# Patient Record
Sex: Female | Born: 1965 | Race: White | Hispanic: No | Marital: Married | Smoking: Never smoker
Health system: Southern US, Community
[De-identification: ages and names within clinical notes are randomized; demographics above are authoritative.]

## PROBLEM LIST (undated history)

## (undated) DIAGNOSIS — R7303 Prediabetes: Secondary | ICD-10-CM

## (undated) DIAGNOSIS — I10 Essential (primary) hypertension: Secondary | ICD-10-CM

## (undated) DIAGNOSIS — N61 Mastitis without abscess: Secondary | ICD-10-CM

## (undated) DIAGNOSIS — E785 Hyperlipidemia, unspecified: Secondary | ICD-10-CM

## (undated) DIAGNOSIS — M8580 Other specified disorders of bone density and structure, unspecified site: Secondary | ICD-10-CM

## (undated) DIAGNOSIS — C50919 Malignant neoplasm of unspecified site of unspecified female breast: Secondary | ICD-10-CM

## (undated) DIAGNOSIS — L270 Generalized skin eruption due to drugs and medicaments taken internally: Secondary | ICD-10-CM

## (undated) DIAGNOSIS — M549 Dorsalgia, unspecified: Secondary | ICD-10-CM

## (undated) DIAGNOSIS — Z8669 Personal history of other diseases of the nervous system and sense organs: Secondary | ICD-10-CM

## (undated) DIAGNOSIS — Z923 Personal history of irradiation: Secondary | ICD-10-CM

## (undated) DIAGNOSIS — K219 Gastro-esophageal reflux disease without esophagitis: Secondary | ICD-10-CM

## (undated) DIAGNOSIS — M2550 Pain in unspecified joint: Secondary | ICD-10-CM

## (undated) DIAGNOSIS — C773 Secondary and unspecified malignant neoplasm of axilla and upper limb lymph nodes: Secondary | ICD-10-CM

## (undated) DIAGNOSIS — B372 Candidiasis of skin and nail: Secondary | ICD-10-CM

## (undated) DIAGNOSIS — Z9221 Personal history of antineoplastic chemotherapy: Secondary | ICD-10-CM

## (undated) DIAGNOSIS — R011 Cardiac murmur, unspecified: Secondary | ICD-10-CM

## (undated) DIAGNOSIS — J45909 Unspecified asthma, uncomplicated: Secondary | ICD-10-CM

## (undated) HISTORY — DX: Hyperlipidemia, unspecified: E78.5

## (undated) HISTORY — PX: TONSILLECTOMY AND ADENOIDECTOMY: SUR1326

## (undated) HISTORY — PX: BREAST LUMPECTOMY: SHX2

## (undated) HISTORY — DX: Pain in unspecified joint: M25.50

## (undated) HISTORY — DX: Prediabetes: R73.03

## (undated) HISTORY — DX: Unspecified asthma, uncomplicated: J45.909

## (undated) HISTORY — DX: Other specified disorders of bone density and structure, unspecified site: M85.80

## (undated) HISTORY — DX: Dorsalgia, unspecified: M54.9

---

## 1998-04-15 ENCOUNTER — Other Ambulatory Visit: Admission: RE | Admit: 1998-04-15 | Discharge: 1998-04-15 | Payer: Self-pay | Admitting: Obstetrics and Gynecology

## 1999-06-20 ENCOUNTER — Other Ambulatory Visit: Admission: RE | Admit: 1999-06-20 | Discharge: 1999-06-20 | Payer: Self-pay | Admitting: Obstetrics and Gynecology

## 1999-11-22 ENCOUNTER — Encounter
Admission: RE | Admit: 1999-11-22 | Discharge: 2000-01-30 | Payer: Self-pay | Admitting: Physical Medicine & Rehabilitation

## 2000-01-29 ENCOUNTER — Encounter: Payer: Self-pay | Admitting: Obstetrics and Gynecology

## 2000-01-29 ENCOUNTER — Encounter (INDEPENDENT_AMBULATORY_CARE_PROVIDER_SITE_OTHER): Payer: Self-pay | Admitting: Specialist

## 2000-01-29 ENCOUNTER — Inpatient Hospital Stay (HOSPITAL_COMMUNITY): Admission: AD | Admit: 2000-01-29 | Discharge: 2000-02-02 | Payer: Self-pay | Admitting: Obstetrics and Gynecology

## 2000-03-15 ENCOUNTER — Other Ambulatory Visit: Admission: RE | Admit: 2000-03-15 | Discharge: 2000-03-15 | Payer: Self-pay | Admitting: Obstetrics and Gynecology

## 2000-05-04 ENCOUNTER — Ambulatory Visit (HOSPITAL_COMMUNITY): Admission: RE | Admit: 2000-05-04 | Discharge: 2000-05-04 | Payer: Self-pay | Admitting: Gastroenterology

## 2000-05-04 ENCOUNTER — Encounter (INDEPENDENT_AMBULATORY_CARE_PROVIDER_SITE_OTHER): Payer: Self-pay | Admitting: Specialist

## 2001-06-14 ENCOUNTER — Encounter: Payer: Self-pay | Admitting: Gastroenterology

## 2001-06-14 ENCOUNTER — Encounter: Admission: RE | Admit: 2001-06-14 | Discharge: 2001-06-14 | Payer: Self-pay | Admitting: Gastroenterology

## 2001-06-17 ENCOUNTER — Other Ambulatory Visit: Admission: RE | Admit: 2001-06-17 | Discharge: 2001-06-17 | Payer: Self-pay | Admitting: Obstetrics and Gynecology

## 2001-09-26 ENCOUNTER — Encounter: Admission: RE | Admit: 2001-09-26 | Discharge: 2001-09-26 | Payer: Self-pay | Admitting: Obstetrics and Gynecology

## 2001-09-26 ENCOUNTER — Encounter: Payer: Self-pay | Admitting: Obstetrics and Gynecology

## 2002-08-27 ENCOUNTER — Other Ambulatory Visit: Admission: RE | Admit: 2002-08-27 | Discharge: 2002-08-27 | Payer: Self-pay | Admitting: Obstetrics and Gynecology

## 2003-09-09 ENCOUNTER — Other Ambulatory Visit: Admission: RE | Admit: 2003-09-09 | Discharge: 2003-09-09 | Payer: Self-pay | Admitting: Obstetrics and Gynecology

## 2004-10-01 ENCOUNTER — Emergency Department (HOSPITAL_COMMUNITY): Admission: EM | Admit: 2004-10-01 | Discharge: 2004-10-02 | Payer: Self-pay | Admitting: Emergency Medicine

## 2004-11-17 ENCOUNTER — Other Ambulatory Visit: Admission: RE | Admit: 2004-11-17 | Discharge: 2004-11-17 | Payer: Self-pay | Admitting: Obstetrics and Gynecology

## 2004-12-01 ENCOUNTER — Ambulatory Visit (HOSPITAL_COMMUNITY): Admission: RE | Admit: 2004-12-01 | Discharge: 2004-12-01 | Payer: Self-pay | Admitting: Obstetrics and Gynecology

## 2005-03-20 ENCOUNTER — Encounter: Admission: RE | Admit: 2005-03-20 | Discharge: 2005-03-20 | Payer: Self-pay | Admitting: Obstetrics and Gynecology

## 2005-11-21 ENCOUNTER — Other Ambulatory Visit: Admission: RE | Admit: 2005-11-21 | Discharge: 2005-11-21 | Payer: Self-pay | Admitting: Obstetrics and Gynecology

## 2006-09-17 ENCOUNTER — Ambulatory Visit (HOSPITAL_COMMUNITY): Admission: RE | Admit: 2006-09-17 | Discharge: 2006-09-17 | Payer: Self-pay | Admitting: Obstetrics and Gynecology

## 2007-01-15 ENCOUNTER — Other Ambulatory Visit: Admission: RE | Admit: 2007-01-15 | Discharge: 2007-01-15 | Payer: Self-pay | Admitting: Obstetrics and Gynecology

## 2007-05-09 DIAGNOSIS — C50919 Malignant neoplasm of unspecified site of unspecified female breast: Secondary | ICD-10-CM

## 2007-05-09 HISTORY — DX: Malignant neoplasm of unspecified site of unspecified female breast: C50.919

## 2007-05-09 HISTORY — PX: BREAST SURGERY: SHX581

## 2007-05-09 HISTORY — PX: OTHER SURGICAL HISTORY: SHX169

## 2007-05-09 HISTORY — PX: BREAST LUMPECTOMY: SHX2

## 2007-07-18 ENCOUNTER — Encounter (INDEPENDENT_AMBULATORY_CARE_PROVIDER_SITE_OTHER): Payer: Self-pay | Admitting: Diagnostic Radiology

## 2007-07-18 ENCOUNTER — Encounter: Admission: RE | Admit: 2007-07-18 | Discharge: 2007-07-18 | Payer: Self-pay | Admitting: Obstetrics and Gynecology

## 2007-07-23 ENCOUNTER — Ambulatory Visit (HOSPITAL_COMMUNITY): Admission: RE | Admit: 2007-07-23 | Discharge: 2007-07-23 | Payer: Self-pay | Admitting: Obstetrics and Gynecology

## 2007-09-10 ENCOUNTER — Ambulatory Visit: Payer: Self-pay | Admitting: Thoracic Surgery

## 2007-09-11 ENCOUNTER — Encounter (HOSPITAL_COMMUNITY): Admission: RE | Admit: 2007-09-11 | Discharge: 2007-10-11 | Payer: Self-pay

## 2009-02-09 IMAGING — MG MM DIAGNOSTIC UNILATERAL L
8 of 9 series · 8 of 9 positions shown · non-contrast
Comparison: none

DG DIAGNOSTIC BILATERAL
Bilateral CC and MLO view(s) were taken.

LEFT BREAST ULTRASOUND
Technologist: Kiri Jim, Medical
DIGITAL BILATERAL DIAGNOSTIC MAMMOGRAM WITH CAD AND LEFT BREAST ULTRASOUND:
CLINICAL DATA: 41-year-old with palpable left breast mass.

[L CC (1 of 3)]
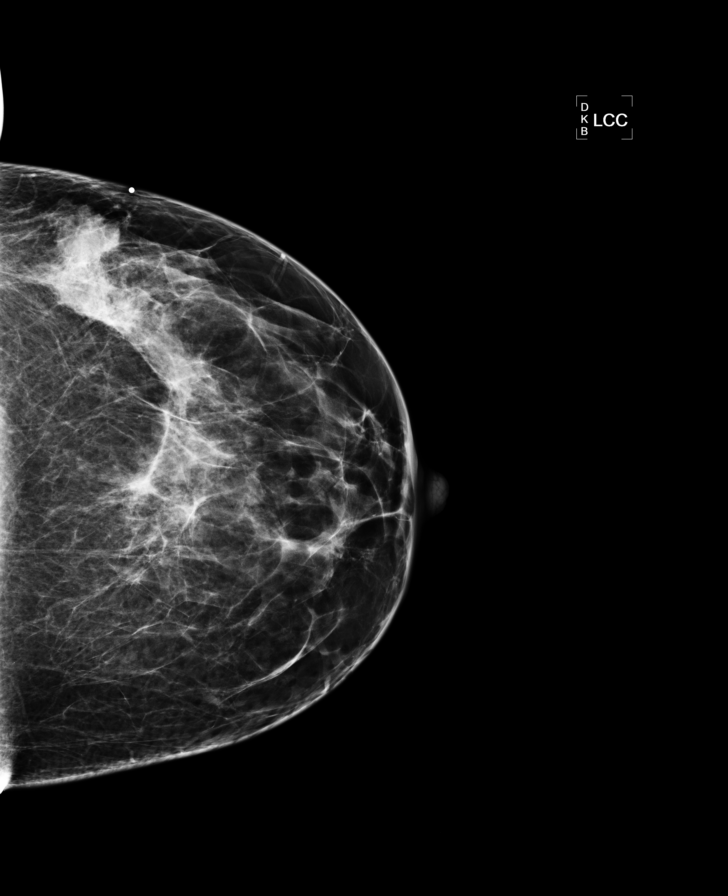

[R CC]
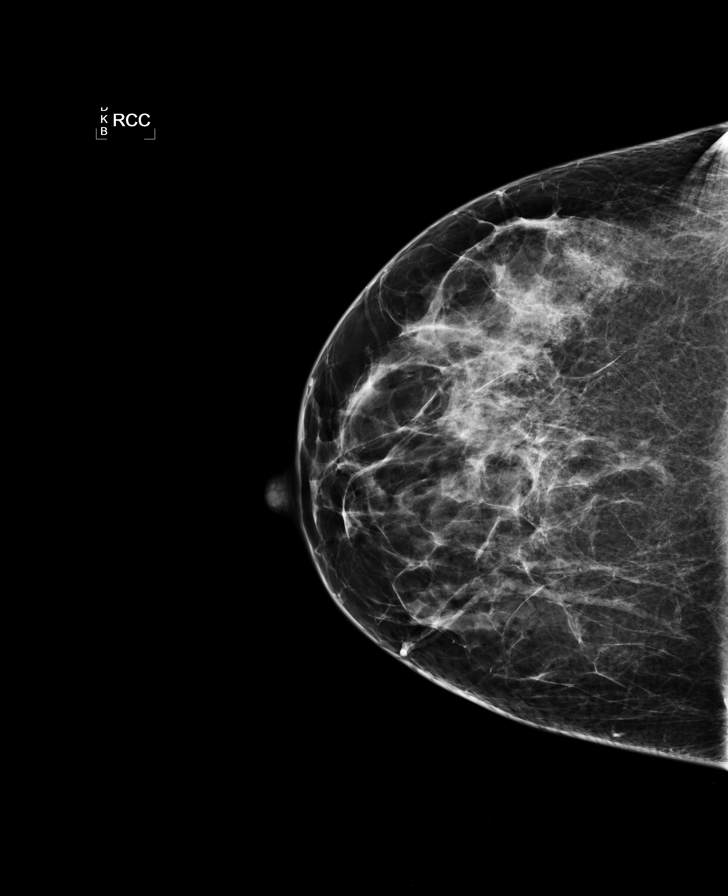

[L CC (2 of 3)]
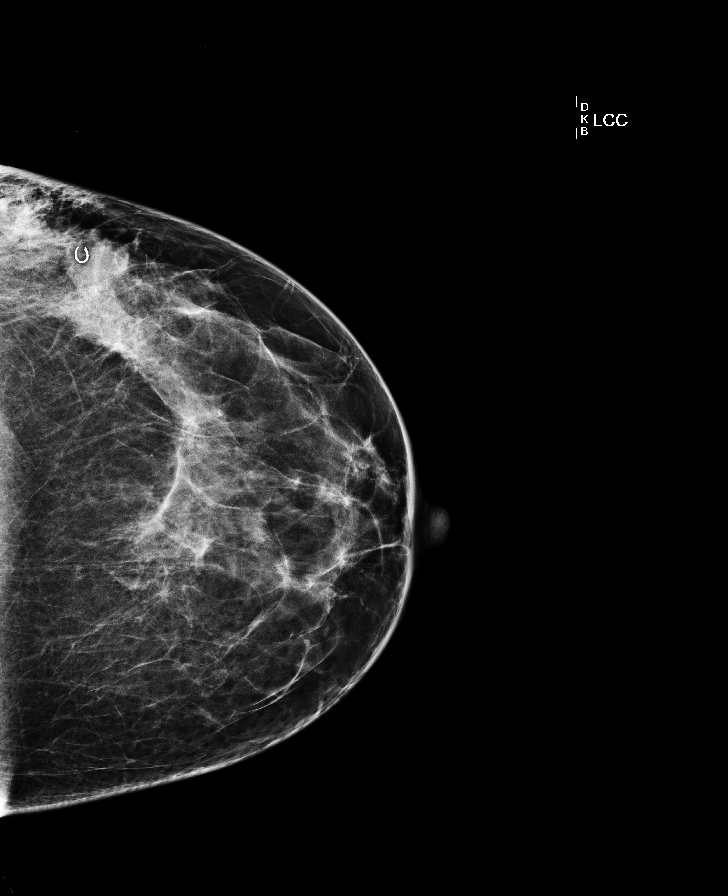

[L MLO]
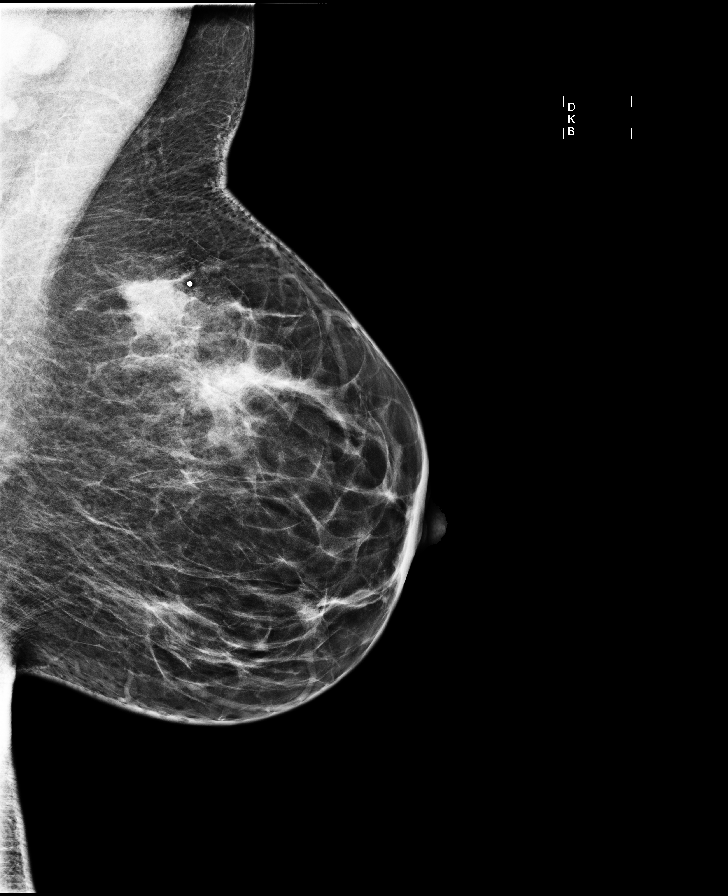

[L TAN]
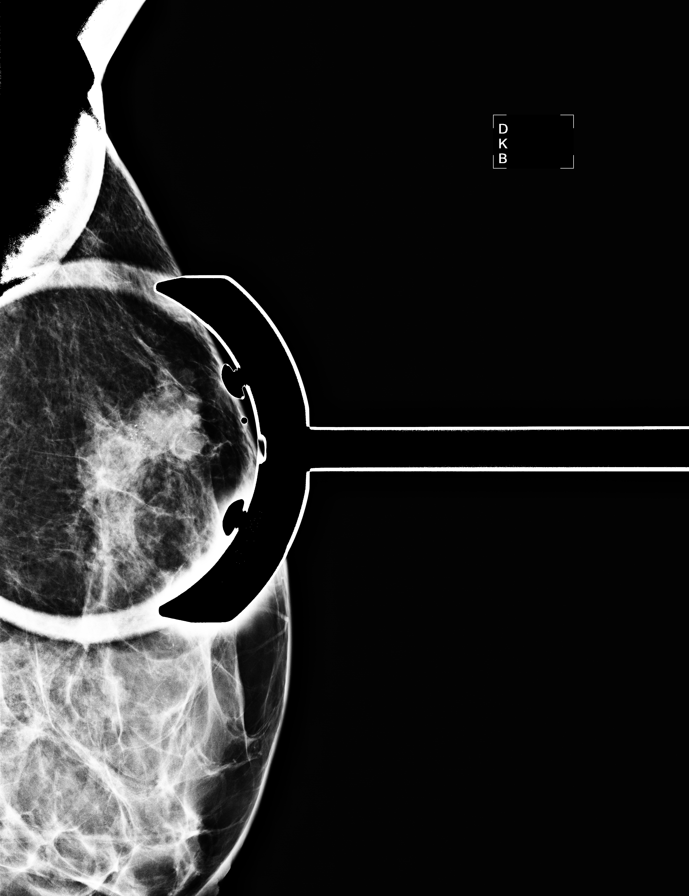

[R MLO]
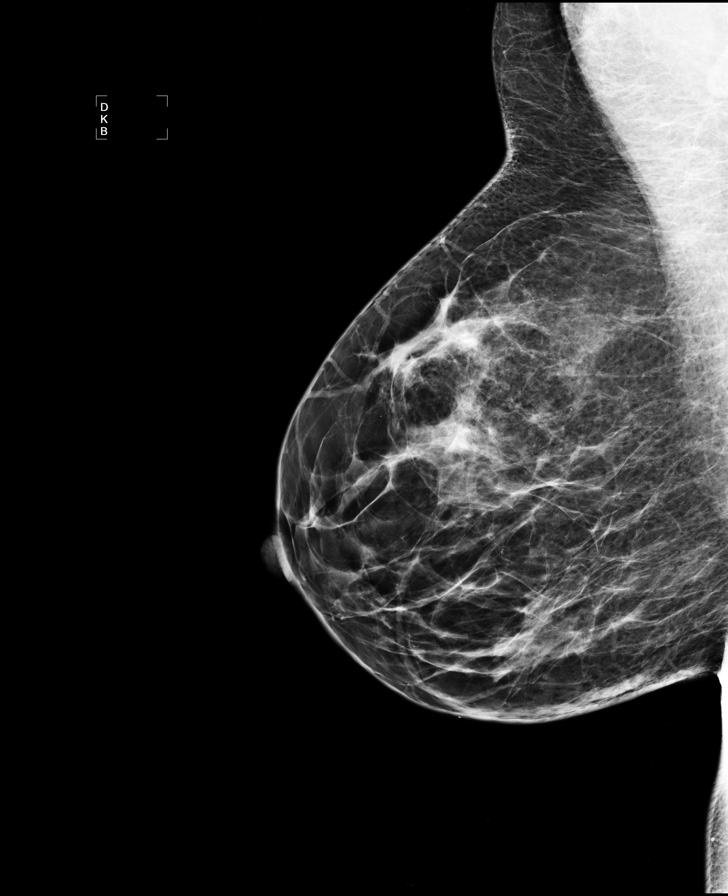

[L CC (3 of 3)]
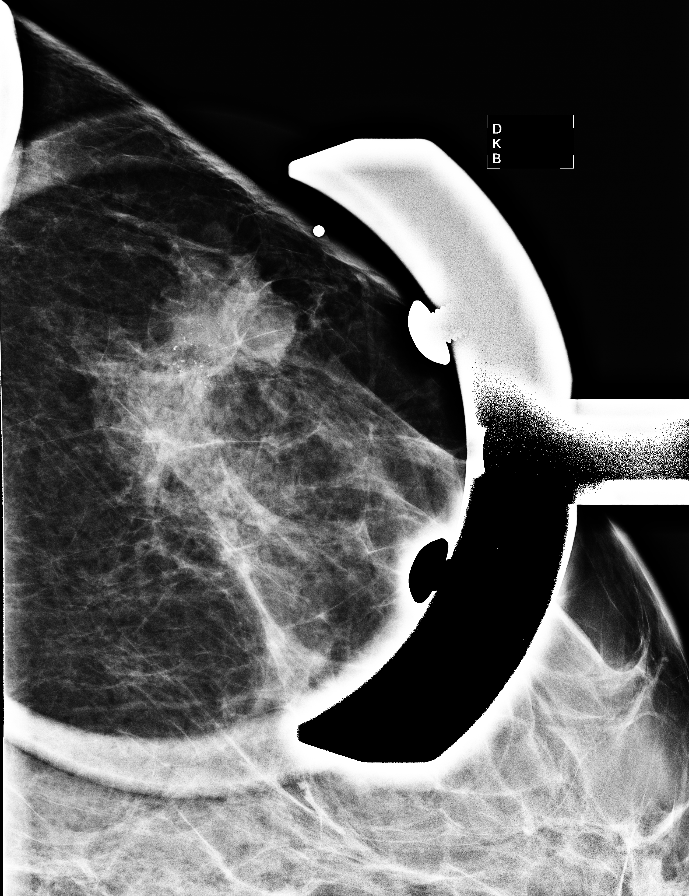

[L ML]
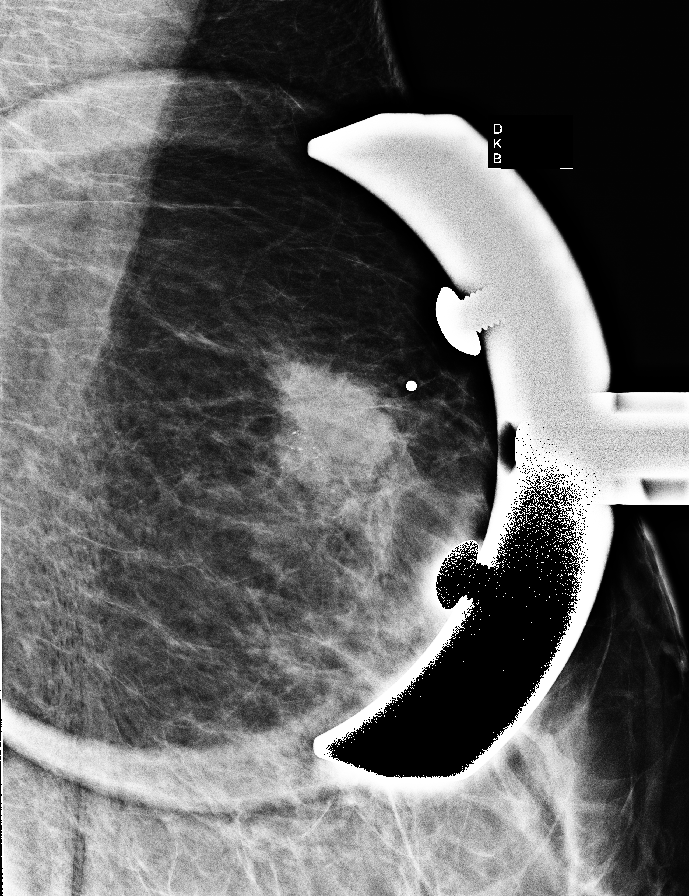

[8 of 9 positions shown; findings below may reference images not displayed]

Comparison mammogram from [HOSPITAL] 09-17-06.  There is an irregular mass in the lateral 
portion of the left breast corresponding to the area of clinical concern.  This mass is associated 
with pleomorphic calcifications.  The findings are worrisome for invasive ductal carcinoma and 
DCIS.  Biopsy is suggested.  No other abnormality is identified on the left.

Right breast shows no mass, architectural distortion, or suspicious calcifications.

On physical exam, I do palpate a firm mobile mass in the 2 o'clock position of the left breast 12 
cm from the nipple.  This lesion is associated with skin dimpling.  Ultrasound of this region shows
an irregular mass associated with internal calcifications which measures 2.3 x 1.2 x 1.6 cm.  
Evaluation of the left axilla shows no enlarged lymph nodes.
IMPRESSION: Mass in the lateral portion of the left breast is suspicious for invasive ductal carcinoma and 
DCIS.  Biopsy is suggested.  We discussed biopsy options. The patient elects ultrasound-guided core
biopsy.  This is performed on the same day and dictated separately.

ASSESSMENT: Highly suggestive of malignancy - BI-RADS 5

Needle biopsy of the left breast.
,

## 2009-05-08 DIAGNOSIS — N61 Mastitis without abscess: Secondary | ICD-10-CM

## 2009-05-08 HISTORY — DX: Mastitis without abscess: N61.0

## 2009-12-24 ENCOUNTER — Emergency Department (HOSPITAL_COMMUNITY): Admission: EM | Admit: 2009-12-24 | Discharge: 2009-12-24 | Payer: Self-pay | Admitting: Emergency Medicine

## 2010-05-29 ENCOUNTER — Encounter: Payer: Self-pay | Admitting: Obstetrics and Gynecology

## 2010-09-20 NOTE — Letter (Signed)
Sep 10, 2007   Artist Pais, M.D.  [address], [address]  [place], [place] [address]   Re:  Karen Trujillo, Karen Trujillo             DOB:  1966/04/18   Dear Lafonda Mosses:   I appreciate the opportunity of seeing Ms. Karen Trujillo. 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  This was done at  [facility]. In the process of getting her MRI of the breast, she was  found to have a cystic 3.1 x 2.1 x 1.8 cm mass in the inferior portion  of the middle mediastinum.  CT scan done at [facility] shows the mass, and  they felt this was a cystic dilatation of the thoracic duct.  Other  possibilities could be a bronchogenic cyst or a duplication cyst at the  esophagus. She is asymptomatic from that standpoint. She had no  dysphasia, hemoptysis, or dyspnea.   MEDICATIONS:  1. She is getting ready to start chemotherapy  2. Atenolol 25 mg a day.  3. Multivitamins.  4. Calcium.  5. She takes Percocet p.r.n.  6. She has been on Reglan p.r.n.  7. Relafen p.r.n.  8. She has had a recent course of Keflex 500 mg three times a day.   ALLERGIES:  She has no allergies.   PAST MEDICAL HISTORY:  1. Hypertension.  2. Hypercholesterolemia.   FAMILY HISTORY:  Noncontributory.   SOCIAL HISTORY:  She is married. Does not smoke or drink alcohol on a  regular basis.   REVIEW OF SYSTEMS:  She been stable.  CARDIAC:  No angina or atrial fibrillation.  PULMONARY:  See History of Present Illness.  No bronchitis or cough.  GI:  No nausea, vomiting, constipation, and no GERD.  GU:  No kidney disease, dysuria or frequent urination.  VASCULAR:  No claudication, DVT, TIAs.  NEUROLOGIC:  No dizziness, headaches, blackouts, seizures.  MUSCULOSKELETAL:  Arthritis.  PSYCHIATRIC:  No psychiatric illness.  HEENT:  No change in her eyesight or hearing.  HEMATOLOGIC:  No  problems with bleeding or clotting disorders.   PHYSICAL EXAMINATION:  She is a well-developed Caucasian female in no  acute distress.  Head, eyes, ears, nose and throat are unremarkable.  Screening pulmonary function tests showed an FVC of 3.27, FEV-1 of 2.80.  Chest is clear to auscultation and percussion. Left breast has surgical  scars.  Heart:  Regular sinus rhythm, no murmur.  Abdomen:  Soft.  There  is no splenomegaly. Pulses are 2+.  There is no clubbing or edema.  Neurological:  Intact.   I feel, after reviewing all the studies, that this probably is a cystic  dilatation of the thoracic duct, and probably nothing needs to be done,  at least not at this time while she is undergoing her chemotherapy.  I  did suggest that she get a followup CT on this in about 6 months just to  be sure that there is no further dilatation. The only problem I have  seen in the past, if this is a thoracic duct cyst, is a kind of thorax.  If she develops any symptomatology or this continues to enlarge from  followup CT scans, I would be happy to see her again at any time.   Sincerely,   Ines Bloomer, M.D.  Electronically Signed  DPB/MEDQ  D:  09/10/2007  T:  09/10/2007  Job:  [id number]

## 2010-09-23 NOTE — Op Note (Signed)
[facility]  Patient:    Karen Trujillo, Karen Trujillo                    MRN: [id number] Proc. Date: 01/30/00 Adm. Date:  [id number] Attending:  Madelyn Flavors                           Operative Report  PREOPERATIVE DIAGNOSES:       1. Intrauterine pregnancy at 40-1/2 weeks                                  estimated gestational age.                               2. Previous low transverse cesarean section.                               3. Arrest of cervical dilation.  POSTOPERATIVE DIAGNOSES:      1. Intrauterine pregnancy at 40-1/2 weeks                                  estimated gestational age.                               2. Previous low transverse cesarean section.                               3. Arrest of cervical dilation.  OPERATION:                    Repeat low transverse cesarean section.  SURGEON:                      Guy Sandifer. Arleta Creek, M.D.  ASSISTANT:  ANESTHESIA:                   Epidural anesthesia.  ANESTHESIOLOGIST:             Ellison Hughs., M.D.  ESTIMATED BLOOD LOSS:         800 cc.  FINDINGS:                     A viable female infant with Apgars of 8 and 9 at one and five minutes, respectively. Birth weight pending. Arterial cord pH 7.32.  INDICATIONS AND CONSENT:      The patient is a 45 year old married white female, G2, P1, history of low transverse cesarean section. She desired to have vaginal birth after C-section.  Risks of uterine rupture and fetal compromise versus  risks of repeat cesarean section have been discussed.  The patient was admitted on November 28, 1999, when she was noted to have an AFI in the office of less than 1 cm.  She underwent Cervidil followed by Pitocin.  At 1:32 p.m. the cervix is 2 cm dilated, 70% effaced, -2 station. Artificial rupture of membranes of clear fluid is carried out.  An IUPC is placed.  The patient continues with an excellent labor pattern and does not progress  beyond 2  cm dilation, 80% effacement and -2 station.  Recommendation for repeat cesarean section is made. Possible risks and complications of the cesarean section are discussed including, but not limited to infection, organ damage, bleeding requiring transfusion of blood products with possible transfusion reaction, HIV and hepatitis acquisition, DVT, PE, and pneumonia. All questions are answered. Consent is signed and on the chart.  DESCRIPTION OF PROCEDURE:     The patient is taken to the operating room where epidural anesthetic is augmented to surgical level.  Foley catheter is in place.  She is placed in the dorsal supine position with 15 degree left lateral wedge. She is prepped and draped in sterile fashion.  After testing for adequate epidural anesthesia, skin is entered through the previous low transverse scar and dissection is carried out in layers to the peritoneum. The peritoneum is sharply entered and extended superiorly and inferiorly. Vesicouterine peritoneum is taken down cephalolaterally.  Bladder flap is developed and the bladder blade is placed.  The uterus is incised in a low transverse manner.  Uterine cavity is entered bluntly with Kelly clamp and then the uterine incision is extended cephalolaterally with the fingers. Infant is noted to be in the right occiput posterior presentation. Vertex is delivered without difficulty.  Oropharynx and nasopharynx are thoroughly suctioned and the infant is delivered.  Good cry and tone is noted.  Cord is clamped and cut.  The infant is handed to the awaiting pediatrics team. Placenta is manually delivered and sent to pathology.  The uterus is closed in running locking fashion with 0 Monocryl suture.  Figure-of-eight sutures are used at the right angle to obtain complete hemostasis.  Tubes and ovaries are normal.  Anterior peritoneum is closed in running fashion with 0 Monocryl sutures which is also used to reapproximate the  pyramidalis muscle in the midline.  The anterior rectus fascia is closed in running fashion with 0 PDS suture and the skin is closed with clips.  All sponge, needle and instrument counts are correct and the patient is transferred to the recovery room in stable condition. DD:  01/30/00 TD:  01/31/00 Job: [id number]

## 2011-06-15 ENCOUNTER — Inpatient Hospital Stay (HOSPITAL_COMMUNITY)
Admission: AD | Admit: 2011-06-15 | Discharge: 2011-06-17 | DRG: 276 | Disposition: A | Discharge status: Home / Self Care | Payer: BC Managed Care – PPO | Source: Ambulatory Visit | Attending: Internal Medicine | Admitting: Internal Medicine

## 2011-06-15 ENCOUNTER — Encounter (HOSPITAL_COMMUNITY): Payer: Self-pay

## 2011-06-15 DIAGNOSIS — Z23 Encounter for immunization: Secondary | ICD-10-CM

## 2011-06-15 DIAGNOSIS — E876 Hypokalemia: Secondary | ICD-10-CM | POA: Diagnosis present

## 2011-06-15 DIAGNOSIS — L0390 Cellulitis, unspecified: Secondary | ICD-10-CM | POA: Diagnosis present

## 2011-06-15 DIAGNOSIS — Z853 Personal history of malignant neoplasm of breast: Secondary | ICD-10-CM

## 2011-06-15 DIAGNOSIS — G43909 Migraine, unspecified, not intractable, without status migrainosus: Secondary | ICD-10-CM | POA: Diagnosis present

## 2011-06-15 DIAGNOSIS — I10 Essential (primary) hypertension: Secondary | ICD-10-CM | POA: Diagnosis present

## 2011-06-15 DIAGNOSIS — T368X5A Adverse effect of other systemic antibiotics, initial encounter: Secondary | ICD-10-CM | POA: Diagnosis not present

## 2011-06-15 DIAGNOSIS — L270 Generalized skin eruption due to drugs and medicaments taken internally: Secondary | ICD-10-CM | POA: Diagnosis not present

## 2011-06-15 DIAGNOSIS — L03319 Cellulitis of trunk, unspecified: Secondary | ICD-10-CM | POA: Diagnosis present

## 2011-06-15 DIAGNOSIS — B372 Candidiasis of skin and nail: Secondary | ICD-10-CM | POA: Diagnosis present

## 2011-06-15 DIAGNOSIS — L539 Erythematous condition, unspecified: Secondary | ICD-10-CM | POA: Diagnosis not present

## 2011-06-15 DIAGNOSIS — L02219 Cutaneous abscess of trunk, unspecified: Secondary | ICD-10-CM | POA: Diagnosis present

## 2011-06-15 DIAGNOSIS — N61 Mastitis without abscess: Principal | ICD-10-CM | POA: Diagnosis present

## 2011-06-15 DIAGNOSIS — D649 Anemia, unspecified: Secondary | ICD-10-CM | POA: Diagnosis present

## 2011-06-15 HISTORY — DX: Essential (primary) hypertension: I10

## 2011-06-15 HISTORY — DX: Personal history of other diseases of the nervous system and sense organs: Z86.69

## 2011-06-15 HISTORY — DX: Malignant neoplasm of unspecified site of unspecified female breast: C50.919

## 2011-06-15 HISTORY — DX: Secondary and unspecified malignant neoplasm of axilla and upper limb lymph nodes: C77.3

## 2011-06-15 HISTORY — DX: Candidiasis of skin and nail: B37.2

## 2011-06-15 HISTORY — DX: Generalized skin eruption due to drugs and medicaments taken internally: L27.0

## 2011-06-15 HISTORY — DX: Mastitis without abscess: N61.0

## 2011-06-15 LAB — COMPREHENSIVE METABOLIC PANEL
ALT: 39 U/L — ABNORMAL HIGH (ref 0–35)
AST: 29 U/L (ref 0–37)
Albumin: 3.4 g/dL — ABNORMAL LOW (ref 3.5–5.2)
Alkaline Phosphatase: 72 U/L (ref 39–117)
CO2: 25 mEq/L (ref 19–32)
Chloride: 104 mEq/L (ref 96–112)
GFR calc non Af Amer: 86 mL/min — ABNORMAL LOW (ref >90)
Potassium: 3.3 mEq/L — ABNORMAL LOW (ref 3.5–5.1)
Total Bilirubin: 0.4 mg/dL (ref 0.3–1.2)

## 2011-06-15 LAB — DIFFERENTIAL
Basophils Absolute: 0 10*3/uL (ref 0.0–0.1)
Basophils Relative: 0 % (ref 0–1)
Eosinophils Absolute: 0.1 10*3/uL (ref 0.0–0.7)
Eosinophils Relative: 1 % (ref 0–5)
Lymphocytes Relative: 13 % (ref 12–46)
Monocytes Absolute: 0.3 10*3/uL (ref 0.1–1.0)

## 2011-06-15 LAB — URINALYSIS, ROUTINE W REFLEX MICROSCOPIC
Glucose, UA: NEGATIVE mg/dL
Hgb urine dipstick: NEGATIVE
Ketones, ur: NEGATIVE mg/dL
Leukocytes, UA: NEGATIVE
pH: 5.5 (ref 5.0–8.0)

## 2011-06-15 LAB — CBC
HCT: 33.8 % — ABNORMAL LOW (ref 36.0–46.0)
MCH: 28.9 pg (ref 26.0–34.0)
MCHC: 33.7 g/dL (ref 30.0–36.0)
MCV: 85.8 fL (ref 78.0–100.0)
RDW: 13.3 % (ref 11.5–15.5)

## 2011-06-15 MED ORDER — LEVOFLOXACIN IN D5W 750 MG/150ML IV SOLN
750.0000 mg | INTRAVENOUS | Status: DC
  Administered 2011-06-15 – 2011-06-16 (×2): 750 mg via INTRAVENOUS
  Filled 2011-06-15 (×4): qty 150

## 2011-06-15 MED ORDER — VANCOMYCIN HCL IN DEXTROSE 1-5 GM/200ML-% IV SOLN
INTRAVENOUS | Status: AC
  Filled 2011-06-15: qty 200

## 2011-06-15 MED ORDER — ALUM & MAG HYDROXIDE-SIMETH 200-200-20 MG/5ML PO SUSP: 30.0000 mL | Freq: Four times a day (QID) | ORAL | Status: DC | PRN

## 2011-06-15 MED ORDER — ONDANSETRON HCL 4 MG/2ML IJ SOLN: 4.0000 mg | Freq: Four times a day (QID) | INTRAMUSCULAR | Status: DC | PRN

## 2011-06-15 MED ORDER — DOCUSATE SODIUM 100 MG PO CAPS
100.0000 mg | ORAL_CAPSULE | Freq: Every day | ORAL | Status: DC
  Administered 2011-06-16 – 2011-06-17 (×2): 100 mg via ORAL
  Filled 2011-06-15 (×2): qty 1

## 2011-06-15 MED ORDER — LETROZOLE 2.5 MG PO TABS
2.5000 mg | ORAL_TABLET | Freq: Every day | ORAL | Status: DC
  Filled 2011-06-15: qty 1

## 2011-06-15 MED ORDER — INFLUENZA VIRUS VACC SPLIT PF IM SUSP
0.5000 mL | INTRAMUSCULAR | Status: AC
  Administered 2011-06-16: 0.5 mL via INTRAMUSCULAR
  Filled 2011-06-15: qty 0.5

## 2011-06-15 MED ORDER — LETROZOLE 2.5 MG PO TABS
2.5000 mg | ORAL_TABLET | Freq: Every day | ORAL | Status: DC
  Administered 2011-06-15 – 2011-06-16 (×2): 2.5 mg via ORAL
  Filled 2011-06-15 (×4): qty 1

## 2011-06-15 MED ORDER — GUAIFENESIN-DM 100-10 MG/5ML PO SYRP: 5.0000 mL | ORAL_SOLUTION | ORAL | Status: DC | PRN

## 2011-06-15 MED ORDER — VANCOMYCIN HCL IN DEXTROSE 1-5 GM/200ML-% IV SOLN
1000.0000 mg | Freq: Two times a day (BID) | INTRAVENOUS | Status: DC
  Administered 2011-06-16: 1000 mg via INTRAVENOUS
  Filled 2011-06-15: qty 200

## 2011-06-15 MED ORDER — OXYCODONE HCL 5 MG PO TABS
5.0000 mg | ORAL_TABLET | ORAL | Status: DC | PRN
  Administered 2011-06-15: 5 mg via ORAL
  Filled 2011-06-15: qty 1

## 2011-06-15 MED ORDER — ACETAMINOPHEN 325 MG PO TABS
650.0000 mg | ORAL_TABLET | Freq: Four times a day (QID) | ORAL | Status: DC | PRN
  Administered 2011-06-15: 650 mg via ORAL
  Filled 2011-06-15: qty 2

## 2011-06-15 MED ORDER — ONDANSETRON HCL 4 MG PO TABS: 4.0000 mg | ORAL_TABLET | Freq: Four times a day (QID) | ORAL | Status: DC | PRN

## 2011-06-15 MED ORDER — POTASSIUM CHLORIDE IN NACL 20-0.9 MEQ/L-% IV SOLN
INTRAVENOUS | Status: DC
  Administered 2011-06-15: 18:00:00 via INTRAVENOUS

## 2011-06-15 MED ORDER — ACETAMINOPHEN 650 MG RE SUPP: 650.0000 mg | Freq: Four times a day (QID) | RECTAL | Status: DC | PRN

## 2011-06-15 MED ORDER — MORPHINE SULFATE 2 MG/ML IJ SOLN: 2.0000 mg | INTRAMUSCULAR | Status: DC | PRN

## 2011-06-15 MED ORDER — SODIUM CHLORIDE 0.9 % IJ SOLN
INTRAMUSCULAR | Status: AC
  Filled 2011-06-15: qty 3

## 2011-06-15 MED ORDER — VANCOMYCIN HCL IN DEXTROSE 1-5 GM/200ML-% IV SOLN
1000.0000 mg | Freq: Once | INTRAVENOUS | Status: AC
  Administered 2011-06-15: 1000 mg via INTRAVENOUS
  Filled 2011-06-15: qty 200
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
## 2011-06-15 NOTE — Progress Notes (Signed)
ANTIBIOTIC CONSULT NOTE - INITIAL  Pharmacy Consult for Vancomycin Indication: Cellulitis  No Known Allergies  Patient Measurements: Height: 5\' 3"  (160 cm) Weight: 171 lb (77.565 kg) IBW/kg (Calculated) : 52.4   Vital Signs: Temp: 97.6 F (36.4 C) (02/07 1552) Temp src: Oral (02/07 1552) BP: 143/86 mmHg (02/07 1552) Pulse Rate: 92  (02/07 1552) Intake/Output from previous day:   Intake/Output from this shift:    Labs:  [facility] 06/15/11 1838  WBC 6.3  HGB 11.4*  PLT 150  LABCREA --  CREATININE 0.81   Estimated Creatinine Clearance: 85.6 ml/min (by C-G formula based on Cr of 0.81). No results found for this basename: VANCOTROUGH:2,VANCOPEAK:2,VANCORANDOM:2,GENTTROUGH:2,GENTPEAK:2,GENTRANDOM:2,TOBRATROUGH:2,TOBRAPEAK:2,TOBRARND:2,AMIKACINPEAK:2,AMIKACINTROU:2,AMIKACIN:2, in the last 72 hours   Microbiology: No results found for this or any previous visit (from the past 720 hour(s)).  Medical History: Past Medical History  Diagnosis Date  . Breast cancer metastasized to axillary lymph node 2009    Status post lumpectomy, axillary node dissection, chemotherapy, and radiation therapy.  . Hypertension   . History of migraine headaches   . Cellulitis of breast 2011    Medications:     . docusate sodium  100 mg Oral Daily  . influenza  inactive virus vaccine  0.5 mL Intramuscular Tomorrow-1000  . letrozole  2.5 mg Oral QHS  . levofloxacin (LEVAQUIN) IV  750 mg Intravenous Q24H  . sodium chloride      . vancomycin  1,000 mg Intravenous Once  . DISCONTD: letrozole  2.5 mg Oral Daily    Assessment: Okay for Protocol  Goal of Therapy:  Vancomycin trough level 10-15 mcg/ml  Plan:  Vancomycin 1000mg  IV every 12 hours. Measure antibiotic drug levels at steady state  Mady Gemma 06/15/2011,8:17 PM

## 2011-06-16 ENCOUNTER — Encounter (HOSPITAL_COMMUNITY): Payer: Self-pay | Admitting: Internal Medicine

## 2011-06-16 DIAGNOSIS — L270 Generalized skin eruption due to drugs and medicaments taken internally: Secondary | ICD-10-CM

## 2011-06-16 HISTORY — DX: Generalized skin eruption due to drugs and medicaments taken internally: L27.0

## 2011-06-16 LAB — CBC
HCT: 34.3 % — ABNORMAL LOW (ref 36.0–46.0)
MCV: 85.8 fL (ref 78.0–100.0)
RBC: 4 MIL/uL (ref 3.87–5.11)
RDW: 13.4 % (ref 11.5–15.5)
WBC: 5.9 10*3/uL (ref 4.0–10.5)

## 2011-06-16 LAB — BASIC METABOLIC PANEL
CO2: 22 mEq/L (ref 19–32)
Chloride: 108 mEq/L (ref 96–112)
Creatinine, Ser: 0.72 mg/dL (ref 0.50–1.10)

## 2011-06-16 MED ORDER — SODIUM CHLORIDE 0.9 % IJ SOLN
INTRAMUSCULAR | Status: AC
  Filled 2011-06-16: qty 3

## 2011-06-16 MED ORDER — VANCOMYCIN HCL IN DEXTROSE 1-5 GM/200ML-% IV SOLN
1000.0000 mg | Freq: Two times a day (BID) | INTRAVENOUS | Status: DC
  Administered 2011-06-16 – 2011-06-17 (×2): 1000 mg via INTRAVENOUS
  Filled 2011-06-16 (×6): qty 200

## 2011-06-16 MED ORDER — VANCOMYCIN HCL IN DEXTROSE 1-5 GM/200ML-% IV SOLN
INTRAVENOUS | Status: AC
  Filled 2011-06-16: qty 200

## 2011-06-16 NOTE — Progress Notes (Signed)
Subjective: The patient has less discomfort over her chest wall but still has some left breast discomfort. She has no subjective fever and chills. She reported having skin flushing last night after receiving vancomycin for the first time. It resolved within a few minutes. The second dose of vancomycin which was given at 6:00 AM this morning resulted in no flushing or side effects.  Objective: Vital signs in last 24 hours: Filed Vitals:   06/15/11 1552 06/15/11 2107 06/16/11 0519  BP: 143/86 124/80 103/54  Pulse: 92 96 81  Temp: 97.6 F (36.4 C) 98.7 F (37.1 C) 99 F (37.2 C)  TempSrc: Oral Oral Oral  Resp: 19 18 18   Height: 5\' 3"  (1.6 m)    Weight: 77.565 kg (171 lb)    SpO2: 98% 96% 97%    Intake/Output Summary (Last 24 hours) at 06/16/11 1412 Last data filed at 06/16/11 0823  Gross per 24 hour  Intake    480 ml  Output      0 ml  Net    480 ml    Weight change:   Physical exam: Lungs: Clear to auscultation bilaterally. Heart: S1, S2, with a soft systolic murmur. Abdomen: Positive bowel sounds, soft, nontender, nondistended. Extremities: No pedal edema. Skin: There is less intense redness/erythema over the central chest wall and on the right and less erythema over the left breast globally and less erythema around the left flank area and back. There is still mild to moderate tenderness over the central portion of the left breast. There is mild induration at the 12:00 position. There is mild global edema over the left breast. No nipple discharge.  Lab Results: Basic Metabolic Panel:  Basename 06/16/11 0528 06/15/11 1838  NA 140 139  K 3.7 3.3*  CL 108 104  CO2 22 25  GLUCOSE 99 164*  BUN 12 19  CREATININE 0.72 0.81  CALCIUM 8.8 9.3  MG -- 1.9  PHOS -- --   Liver Function Tests:  [facility] 06/15/11 1838  AST 29  ALT 39*  ALKPHOS 72  BILITOT 0.4  PROT 7.0  ALBUMIN 3.4*   No results found for this basename: LIPASE:2,AMYLASE:2 in the last 72 hours No results  found for this basename: AMMONIA:2 in the last 72 hours CBC:  Basename 06/16/11 0528 06/15/11 1838  WBC 5.9 6.3  NEUTROABS -- 5.1  HGB 11.8* 11.4*  HCT 34.3* 33.8*  MCV 85.8 85.8  PLT 154 150   Cardiac Enzymes: No results found for this basename: CKTOTAL:3,CKMB:3,CKMBINDEX:3,TROPONINI:3 in the last 72 hours BNP: No results found for this basename: PROBNP:3 in the last 72 hours D-Dimer: No results found for this basename: DDIMER:2 in the last 72 hours CBG: No results found for this basename: GLUCAP:6 in the last 72 hours Hemoglobin A1C: No results found for this basename: HGBA1C in the last 72 hours Fasting Lipid Panel: No results found for this basename: CHOL,HDL,LDLCALC,TRIG,CHOLHDL,LDLDIRECT in the last 72 hours Thyroid Function Tests: No results found for this basename: TSH,T4TOTAL,FREET4,T3FREE,THYROIDAB in the last 72 hours Anemia Panel: No results found for this basename: VITAMINB12,FOLATE,FERRITIN,TIBC,IRON,RETICCTPCT in the last 72 hours Coagulation:  Basename 06/15/11 1838  LABPROT 14.9  INR 1.15   Urine Drug Screen: Drugs of Abuse  No results found for this basename: labopia, cocainscrnur, labbenz, amphetmu, thcu, labbarb    Alcohol Level: No results found for this basename: ETH:2 in the last 72 hours Urinalysis:  Basename 06/15/11 1907  COLORURINE YELLOW  LABSPEC 1.020  PHURINE 5.5  GLUCOSEU NEGATIVE  HGBUR NEGATIVE  BILIRUBINUR NEGATIVE  KETONESUR NEGATIVE  PROTEINUR NEGATIVE  UROBILINOGEN 0.2  NITRITE NEGATIVE  LEUKOCYTESUR NEGATIVE   Misc. Labs:   Micro: No results found for this or any previous visit (from the past 240 hour(s)).  Studies/Results: No results found.  Medications: I have reviewed the patient's current medications.  Assessment: Active Problems:  Mastitis  Cellulitis of breast  History of breast cancer in female  Hypertension  Red man syndrome  43 are old woman with a past medical history significant for left-sided  breast cancer. She has left chest wall cellulitis and possible concomitant mastitis. She is being treated with IV Levaquin and vancomycin. It appears that she had a mild case of red man syndrome following the initial administration of vancomycin which did not occur upon the repeat administration. There is some improvement clinically and symptomatically, but clearly, she continues to need IV antibiotics. We'll continue antibiotics and supportive treatment. We'll discontinue IV fluids as the patient appears to be well hydrated.   Plan: As above.   LOS: 1 day   Noma Quijas 06/16/2011, 2:12 PM

## 2011-06-17 ENCOUNTER — Encounter (HOSPITAL_COMMUNITY): Payer: Self-pay | Admitting: Internal Medicine

## 2011-06-17 DIAGNOSIS — E876 Hypokalemia: Secondary | ICD-10-CM | POA: Diagnosis present

## 2011-06-17 DIAGNOSIS — D649 Anemia, unspecified: Secondary | ICD-10-CM | POA: Diagnosis present

## 2011-06-17 DIAGNOSIS — B372 Candidiasis of skin and nail: Secondary | ICD-10-CM

## 2011-06-17 HISTORY — DX: Candidiasis of skin and nail: B37.2

## 2011-06-17 LAB — VANCOMYCIN, TROUGH: Vancomycin Tr: 5.3 ug/mL — ABNORMAL LOW (ref 10.0–20.0)

## 2011-06-17 MED ORDER — FLUCONAZOLE 150 MG PO TABS: 150.0000 mg | ORAL_TABLET | Freq: Every day | ORAL | Status: AC | PRN

## 2011-06-17 MED ORDER — LEVOFLOXACIN 750 MG PO TABS: 750.0000 mg | ORAL_TABLET | Freq: Every day | ORAL | Status: AC

## 2011-06-17 MED ORDER — FLUCONAZOLE 100 MG PO TABS
150.0000 mg | ORAL_TABLET | Freq: Every day | ORAL | Status: DC
  Administered 2011-06-17: 150 mg via ORAL
  Filled 2011-06-17 (×2): qty 1

## 2011-06-17 MED ORDER — VANCOMYCIN HCL 1000 MG IV SOLR
1500.0000 mg | Freq: Two times a day (BID) | INTRAVENOUS | Status: DC
  Filled 2011-06-17 (×4): qty 1500

## 2011-06-17 NOTE — Progress Notes (Signed)
ANTIBIOTIC CONSULT NOTE -  Pharmacy Consult for Vancomycin Indication: Cellulitis  No Known Allergies  Patient Measurements: Height: 5\' 3"  (160 cm) Weight: 171 lb (77.565 kg) IBW/kg (Calculated) : 52.4   Vital Signs: Temp: 98.4 F (36.9 C) (02/09 0513) Temp src: Oral (02/09 0513) BP: 119/74 mmHg (02/09 0513) Pulse Rate: 74  (02/09 0513) Intake/Output from previous day: 02/08 0701 - 02/09 0700 In: 680 [P.O.:480; IV Piggyback:200] Out: -  Intake/Output from this shift:    Labs:  [facility] 06/16/11 0528 06/15/11 1838  WBC 5.9 6.3  HGB 11.8* 11.4*  PLT 154 150  LABCREA -- --  CREATININE 0.72 0.81   Estimated Creatinine Clearance: 86.7 ml/min (by C-G formula based on Cr of 0.72).  Basename 06/17/11 0634  VANCOTROUGH 5.3*  VANCOPEAK --  Drue Dun --  GENTTROUGH --  GENTPEAK --  GENTRANDOM --  TOBRATROUGH --  TOBRAPEAK --  TOBRARND --  AMIKACINPEAK --  AMIKACINTROU --  AMIKACIN --     Microbiology: No results found for this or any previous visit (from the past 720 hour(s)).  Medical History: Past Medical History  Diagnosis Date  . Breast cancer metastasized to axillary lymph node 2009    Status post lumpectomy, axillary node dissection, chemotherapy, and radiation therapy.  . Hypertension   . History of migraine headaches   . Cellulitis of breast 2011  . Red man syndrome 06/16/2011    Medications:     . docusate sodium  100 mg Oral Daily  . influenza  inactive virus vaccine  0.5 mL Intramuscular Tomorrow-1000  . letrozole  2.5 mg Oral QHS  . levofloxacin (LEVAQUIN) IV  750 mg Intravenous Q24H  . sodium chloride      . sodium chloride      . vancomycin  1,500 mg Intravenous Q12H  . DISCONTD: vancomycin  1,000 mg Intravenous Q12H  . DISCONTD: vancomycin  1,000 mg Intravenous Q12H    Assessment: Low trough  Goal of Therapy:  Vancomycin trough level 10-15 mcg/ml  Plan:  Discontinue Vancomycin 1000mg  IV every 12 hours. Vancomycin 1500mg  IV  every 12 hours. Measure antibiotic drug levels at steady [address], [place] J 06/17/2011,8:17 AM

## 2011-06-17 NOTE — Plan of Care (Signed)
Problem: Phase I Progression Outcomes Goal: OOB as tolerated unless otherwise ordered Outcome: Completed/Met Date Met:  06/17/11 Patient walked her family to the elevators this evening. She has a stable gait.

## 2011-06-17 NOTE — Plan of Care (Signed)
Problem: Phase I Progression Outcomes Goal: Voiding-avoid urinary catheter unless indicated Outcome: Completed/Met Date Met:  06/17/11 She does not have a foley catheter and is voididng freely

## 2011-06-17 NOTE — Plan of Care (Signed)
Problem: Phase I Progression Outcomes Goal: Initial discharge plan identified Outcome: Completed/Met Date Met:  06/17/11 She states that she will return to her home with her family.

## 2011-06-17 NOTE — Plan of Care (Signed)
Problem: Discharge Progression Outcomes Goal: Other Discharge Outcomes/Goals Outcome: Completed/Met Date Met:  06/17/11 Discharge instructions read to pt  Pt verbalized understanding of all instructions RX given to pt  IV removed from rt [facility] cath tip intact Pt waiting for family for ride home

## 2011-06-17 NOTE — Discharge Summary (Signed)
Physician Discharge Summary  Karen Trujillo MRN: [id number] DOB/AGE: 47/04/1966 47 y.o.  PCP: [name], MD, MD   Admit date: 06/15/2011 Discharge date: 06/17/2011  Discharge Diagnoses:  1. Recurrent cellulitis and mild mastitis of the left breast. 2. Concomitant candidal intertrigo. 3. Red man syndrome once following the initial administration of IV vancomycin. It subsequently resolved with ongoing treatment. 4. Elevated blood pressure which subsequently normalized. 5. History of breast cancer on the left, status post lumpectomy, status post radiation therapy, status post chemotherapy. 6. Mild normocytic anemia. 7. Hypokalemia, resolved with supplementation.    Medication List  As of 06/17/2011 10:40 AM   TAKE these medications         fluconazole 150 MG tablet   Commonly known as: DIFLUCAN   Take 1 tablet (150 mg total) by mouth daily as needed (For recurrent yeast infections.).      ibuprofen 200 MG tablet   Commonly known as: ADVIL,MOTRIN   Take 600-800 mg by mouth every 6 (six) hours as needed. Pain      letrozole 2.5 MG tablet   Commonly known as: FEMARA   Take 2.5 mg by mouth at bedtime.      levofloxacin 750 MG tablet   Commonly known as: LEVAQUIN   Take 1 tablet (750 mg total) by mouth daily. CONTINUE DAILY UNTIL COMPLETE.            Discharge Condition: Improved and stable.  Disposition:    Consults: none   Significant Diagnostic Studies: No results found.   Microbiology: No results found for this or any previous visit (from the past 240 hour(s)).   Labs: Results for orders placed during the hospital encounter of 06/15/11 (from the past 48 hour(s))  COMPREHENSIVE METABOLIC PANEL     Status: Abnormal   Collection Time   06/15/11  6:38 PM      Component Value Range Comment   Sodium 139  135 - 145 (mEq/L)    Potassium 3.3 (*) 3.5 - 5.1 (mEq/L)    Chloride 104  96 - 112 (mEq/L)    CO2 25  19 - 32 (mEq/L)    Glucose, Bld 164 (*) 70 - 99  (mg/dL)    BUN 19  6 - 23 (mg/dL)    Creatinine, Ser 4.09  0.50 - 1.10 (mg/dL)    Calcium 9.3  8.4 - 10.5 (mg/dL)    Total Protein 7.0  6.0 - 8.3 (g/dL)    Albumin 3.4 (*) 3.5 - 5.2 (g/dL)    AST 29  0 - 37 (U/L)    ALT 39 (*) 0 - 35 (U/L)    Alkaline Phosphatase 72  39 - 117 (U/L)    Total Bilirubin 0.4  0.3 - 1.2 (mg/dL)    GFR calc non Af Amer 86 (*) >90 (mL/min)    GFR calc Af Amer >90  >90 (mL/min)   MAGNESIUM     Status: Normal   Collection Time   06/15/11  6:38 PM      Component Value Range Comment   Magnesium 1.9  1.5 - 2.5 (mg/dL)   CBC     Status: Abnormal   Collection Time   06/15/11  6:38 PM      Component Value Range Comment   WBC 6.3  4.0 - 10.5 (K/uL)    RBC 3.94  3.87 - 5.11 (MIL/uL)    Hemoglobin 11.4 (*) 12.0 - 15.0 (g/dL)    HCT 81.1 (*) 91.4 - 46.0 (%)  MCV 85.8  78.0 - 100.0 (fL)    MCH 28.9  26.0 - 34.0 (pg)    MCHC 33.7  30.0 - 36.0 (g/dL)    RDW 16.1  09.6 - 04.5 (%)    Platelets 150  150 - 400 (K/uL)   DIFFERENTIAL     Status: Abnormal   Collection Time   06/15/11  6:38 PM      Component Value Range Comment   Neutrophils Relative 81 (*) 43 - 77 (%)    Neutro Abs 5.1  1.7 - 7.7 (K/uL)    Lymphocytes Relative 13  12 - 46 (%)    Lymphs Abs 0.8  0.7 - 4.0 (K/uL)    Monocytes Relative 5  3 - 12 (%)    Monocytes Absolute 0.3  0.1 - 1.0 (K/uL)    Eosinophils Relative 1  0 - 5 (%)    Eosinophils Absolute 0.1  0.0 - 0.7 (K/uL)    Basophils Relative 0  0 - 1 (%)    Basophils Absolute 0.0  0.0 - 0.1 (K/uL)   TSH     Status: Normal   Collection Time   06/15/11  6:38 PM      Component Value Range Comment   TSH 1.370  0.350 - 4.500 (uIU/mL)   HEMOGLOBIN A1C     Status: Normal   Collection Time   06/15/11  6:38 PM      Component Value Range Comment   Hemoglobin A1C 5.5  <5.7 (%)    Mean Plasma Glucose 111  <117 (mg/dL)   PROTIME-INR     Status: Normal   Collection Time   06/15/11  6:38 PM      Component Value Range Comment   Prothrombin Time 14.9  11.6 -  15.2 (seconds)    INR 1.15  0.00 - 1.49    URINALYSIS, ROUTINE W REFLEX MICROSCOPIC     Status: Normal   Collection Time   06/15/11  7:07 PM      Component Value Range Comment   Color, Urine YELLOW  YELLOW     APPearance CLEAR  CLEAR     Specific Gravity, Urine 1.020  1.005 - 1.030     pH 5.5  5.0 - 8.0     Glucose, UA NEGATIVE  NEGATIVE (mg/dL)    Hgb urine dipstick NEGATIVE  NEGATIVE     Bilirubin Urine NEGATIVE  NEGATIVE     Ketones, ur NEGATIVE  NEGATIVE (mg/dL)    Protein, ur NEGATIVE  NEGATIVE (mg/dL)    Urobilinogen, UA 0.2  0.0 - 1.0 (mg/dL)    Nitrite NEGATIVE  NEGATIVE     Leukocytes, UA NEGATIVE  NEGATIVE  MICROSCOPIC NOT DONE ON URINES WITH NEGATIVE PROTEIN, BLOOD, LEUKOCYTES, NITRITE, OR GLUCOSE <1000 mg/dL.  BASIC METABOLIC PANEL     Status: Normal   Collection Time   06/16/11  5:28 AM      Component Value Range Comment   Sodium 140  135 - 145 (mEq/L)    Potassium 3.7  3.5 - 5.1 (mEq/L)    Chloride 108  96 - 112 (mEq/L)    CO2 22  19 - 32 (mEq/L)    Glucose, Bld 99  70 - 99 (mg/dL)    BUN 12  6 - 23 (mg/dL)    Creatinine, Ser 4.09  0.50 - 1.10 (mg/dL)    Calcium 8.8  8.4 - 10.5 (mg/dL)    GFR calc non Af Amer >90  >90 (mL/min)  GFR calc Af Amer >90  >90 (mL/min)   CBC     Status: Abnormal   Collection Time   06/16/11  5:28 AM      Component Value Range Comment   WBC 5.9  4.0 - 10.5 (K/uL)    RBC 4.00  3.87 - 5.11 (MIL/uL)    Hemoglobin 11.8 (*) 12.0 - 15.0 (g/dL)    HCT 62.9 (*) 52.8 - 46.0 (%)    MCV 85.8  78.0 - 100.0 (fL)    MCH 29.5  26.0 - 34.0 (pg)    MCHC 34.4  30.0 - 36.0 (g/dL)    RDW 41.3  24.4 - 01.0 (%)    Platelets 154  150 - 400 (K/uL)   VANCOMYCIN, TROUGH     Status: Abnormal   Collection Time   06/17/11  6:34 AM      Component Value Range Comment   Vancomycin Tr 5.3 (*) 10.0 - 20.0 (ug/mL)      HPI : The patient is a 46 year old woman with a past medical history significant for left breast cellulitis, breast cancer on the left, and  headaches, who presented to the hospital as a direct admission for recurrent cellulitis of the left breast and chest wall. She had been started on Levaquin by her primary care physician Dr. Margo Aye. She had only taken it for one day. However, when she did not improve, it was felt that she would needed IV antibiotics for treatment.  HOSPITAL COURSE: The patient was started on treatment with IV vancomycin and IV Levaquin. Cool and warm compresses were ordered as needed for comfort. Analgesics were ordered as needed for pain. Gentle IV fluid hydration was started. For further evaluation, a hemoglobin A1c and TSH were ordered. Her hemoglobin A1c was within normal limits at 5.5. Her TSH was within normal limits at 1.3.  During the initial administration of vancomycin, she experienced red man syndrome. It was resolved within minutes. With the subsequent administrations of vancomycin, she had no reaction.  Over the course of the hospitalization, the erythema, edema, and induration subsided substantially over her left breast, but did not completely resolve before discharge. The left back wall cellulitis and the right chest wall cellulitis resolved. The patient preferred discharge with the given improvement. Prior to discharge, it was noted that she did have some candidal intertrigo underneath her left greater than right breasts. Therefore, she was given one dose of Diflucan. She was given a prescription for Diflucan to take as needed for recurrent candidal intertrigo. She was instructed to try to keep underneath her breasts clean and dry. It was also suggested that she try over-the-counter nystatin powder underneath her breasts as needed. She was also instructed to change clothes damp from perspiration quickly following her workouts. She voiced understanding. The nidus for bacterial infection is the skin breaks from the candidal infection. Also, it was suggested that her husband not perform oral stimulation to her left  breast for now.    Discharge Exam: Blood pressure 119/74, pulse 74, temperature 98.4 F (36.9 C), temperature source Oral, resp. rate 18, height 5\' 3"  (1.6 m), weight 77.565 kg (171 lb), SpO2 97.00%.  Lungs: Clear to auscultation bilaterally. Heart: S1, S2, with a soft systolic murmur. Abdomen: Mildly obese, positive bowel sounds, nontender, nondistended. Skin: Resolution of erythema of the right chest and central chest. Resolution of the erythema of the left flank. There is still mild erythema, mild induration, and mild edema of the left breast which has significantly decreased  since yesterday. Only mildly tender.   Discharge Orders    Future Orders Please Complete By Expires   Diet - low sodium heart healthy      Increase activity slowly      Discharge instructions      Comments:   KEEP SKIN UNDERNEATH BREASTS AS DRY AS POSSIBLE.      Follow-up Information    Follow up with [facility], MD. (Follow up in 1 week. Call for the appointment.)    Contact information:   [address] [place] [address] [phone]          Discharge time: 35 minutes.  Signed: Other Atienza 06/17/2011, 10:40 AM

## 2014-07-10 ENCOUNTER — Other Ambulatory Visit (HOSPITAL_COMMUNITY): Payer: Self-pay | Admitting: Internal Medicine

## 2014-07-10 DIAGNOSIS — M8580 Other specified disorders of bone density and structure, unspecified site: Secondary | ICD-10-CM

## 2014-07-15 ENCOUNTER — Other Ambulatory Visit (HOSPITAL_COMMUNITY): Payer: Self-pay

## 2014-08-06 ENCOUNTER — Ambulatory Visit (HOSPITAL_COMMUNITY)
Admission: RE | Admit: 2014-08-06 | Discharge: 2014-08-06 | Disposition: A | Discharge status: Home / Self Care | Payer: BLUE CROSS/BLUE SHIELD | Source: Ambulatory Visit | Attending: Internal Medicine | Admitting: Internal Medicine

## 2014-08-06 DIAGNOSIS — M899 Disorder of bone, unspecified: Secondary | ICD-10-CM | POA: Insufficient documentation

## 2014-08-06 DIAGNOSIS — M8580 Other specified disorders of bone density and structure, unspecified site: Secondary | ICD-10-CM

## 2015-08-10 ENCOUNTER — Encounter (INDEPENDENT_AMBULATORY_CARE_PROVIDER_SITE_OTHER): Payer: Self-pay | Admitting: *Deleted

## 2015-09-16 ENCOUNTER — Other Ambulatory Visit (INDEPENDENT_AMBULATORY_CARE_PROVIDER_SITE_OTHER): Payer: Self-pay | Admitting: *Deleted

## 2015-09-16 ENCOUNTER — Encounter (INDEPENDENT_AMBULATORY_CARE_PROVIDER_SITE_OTHER): Payer: Self-pay | Admitting: *Deleted

## 2015-09-16 DIAGNOSIS — Z800 Family history of malignant neoplasm of digestive organs: Secondary | ICD-10-CM

## 2015-09-16 DIAGNOSIS — Z1211 Encounter for screening for malignant neoplasm of colon: Secondary | ICD-10-CM

## 2015-11-12 ENCOUNTER — Other Ambulatory Visit (INDEPENDENT_AMBULATORY_CARE_PROVIDER_SITE_OTHER): Payer: Self-pay | Admitting: *Deleted

## 2015-11-12 ENCOUNTER — Encounter (INDEPENDENT_AMBULATORY_CARE_PROVIDER_SITE_OTHER): Payer: Self-pay | Admitting: *Deleted

## 2015-11-12 NOTE — Telephone Encounter (Signed)
Patient needs trilyte 

## 2015-11-15 MED ORDER — PEG 3350-KCL-NA BICARB-NACL 420 G PO SOLR: 4000.0000 mL | Freq: Once | ORAL | Status: DC

## 2015-12-15 ENCOUNTER — Telehealth (INDEPENDENT_AMBULATORY_CARE_PROVIDER_SITE_OTHER): Payer: Self-pay | Admitting: *Deleted

## 2015-12-15 NOTE — Telephone Encounter (Signed)
Referring MD/PCP: hall   Procedure: tcs  Reason/Indication:  Screening, fam hx colon ca  Has patient had this procedure before?  Yes, about 10 yrs ago  If so, when, by whom and where?    Is there a family history of colon cancer?  no  Who?  What age when diagnosed?    Is patient diabetic?   no      Does patient have prosthetic heart valve or mechanical valve?  no  Do you have a pacemaker?  o  Has patient ever had endocarditis? no  Has patient had joint replacement within last 12 months?  no  Does patient tend to be constipated or take laxatives? some  Does patient have a history of alcohol/drug use?  no  Is patient on Coumadin, Plavix and/or Aspirin? no  Medications: fernara 2.5 mg daily, lisinopril 5 mg daily, protonix 40 mg daily, phentermine 37.5 mg daily, vitamins, tumeric daily, ibuporfen prn  Allergies: nkda  Medication Adjustment:   Procedure date & time: 01/06/16 at 730

## 2015-12-22 NOTE — Telephone Encounter (Signed)
agree

## 2015-12-24 ENCOUNTER — Other Ambulatory Visit (HOSPITAL_COMMUNITY): Payer: Self-pay | Admitting: Internal Medicine

## 2015-12-24 DIAGNOSIS — Z13820 Encounter for screening for osteoporosis: Secondary | ICD-10-CM

## 2016-01-06 ENCOUNTER — Ambulatory Visit (HOSPITAL_COMMUNITY)
Admission: RE | Admit: 2016-01-06 | Discharge: 2016-01-06 | Disposition: A | Discharge status: Home / Self Care | Payer: BLUE CROSS/BLUE SHIELD | Source: Ambulatory Visit | Attending: Internal Medicine | Admitting: Internal Medicine

## 2016-01-06 ENCOUNTER — Encounter (HOSPITAL_COMMUNITY)
Admission: RE | Disposition: A | Discharge status: Home / Self Care | Payer: Self-pay | Source: Ambulatory Visit | Attending: Internal Medicine

## 2016-01-06 ENCOUNTER — Encounter (HOSPITAL_COMMUNITY): Payer: Self-pay | Admitting: *Deleted

## 2016-01-06 DIAGNOSIS — K6289 Other specified diseases of anus and rectum: Secondary | ICD-10-CM | POA: Insufficient documentation

## 2016-01-06 DIAGNOSIS — K219 Gastro-esophageal reflux disease without esophagitis: Secondary | ICD-10-CM | POA: Insufficient documentation

## 2016-01-06 DIAGNOSIS — I10 Essential (primary) hypertension: Secondary | ICD-10-CM | POA: Insufficient documentation

## 2016-01-06 DIAGNOSIS — Z79899 Other long term (current) drug therapy: Secondary | ICD-10-CM | POA: Diagnosis not present

## 2016-01-06 DIAGNOSIS — Z800 Family history of malignant neoplasm of digestive organs: Secondary | ICD-10-CM | POA: Diagnosis not present

## 2016-01-06 DIAGNOSIS — Z853 Personal history of malignant neoplasm of breast: Secondary | ICD-10-CM | POA: Insufficient documentation

## 2016-01-06 DIAGNOSIS — Z1211 Encounter for screening for malignant neoplasm of colon: Secondary | ICD-10-CM | POA: Insufficient documentation

## 2016-01-06 HISTORY — DX: Cardiac murmur, unspecified: R01.1

## 2016-01-06 HISTORY — DX: Gastro-esophageal reflux disease without esophagitis: K21.9

## 2016-01-06 HISTORY — PX: COLONOSCOPY: SHX5424

## 2016-01-06 SURGERY — COLONOSCOPY
Anesthesia: Moderate Sedation

## 2016-01-06 MED ORDER — MEPERIDINE HCL 50 MG/ML IJ SOLN
INTRAMUSCULAR | Status: DC | PRN
  Administered 2016-01-06 (×2): 25 mg via INTRAVENOUS

## 2016-01-06 MED ORDER — PANTOPRAZOLE SODIUM 20 MG PO TBEC: 20.0000 mg | DELAYED_RELEASE_TABLET | Freq: Two times a day (BID) | ORAL | 3 refills | Status: DC

## 2016-01-06 MED ORDER — MEPERIDINE HCL 50 MG/ML IJ SOLN
INTRAMUSCULAR | Status: AC
  Filled 2016-01-06: qty 1

## 2016-01-06 MED ORDER — SODIUM CHLORIDE 0.9 % IV SOLN
INTRAVENOUS | Status: DC
  Administered 2016-01-06: 1000 mL via INTRAVENOUS

## 2016-01-06 MED ORDER — MIDAZOLAM HCL 5 MG/5ML IJ SOLN
INTRAMUSCULAR | Status: AC
  Filled 2016-01-06: qty 10

## 2016-01-06 MED ORDER — MIDAZOLAM HCL 5 MG/5ML IJ SOLN
INTRAMUSCULAR | Status: DC | PRN
  Administered 2016-01-06: 1 mg via INTRAVENOUS
  Administered 2016-01-06 (×4): 2 mg via INTRAVENOUS

## 2016-01-06 NOTE — Op Note (Signed)
[facility] Patient Name: Karen Trujillo Procedure Date: 01/06/2016 7:07 AM MRN: [id number] Date of Birth: July 29, 1965 Attending MD: Hildred Laser , MD CSN: [id number] Age: 50 Admit Type: Outpatient Procedure:                Colonoscopy Indications:              Screening for colorectal malignant neoplasm Providers:                Hildred Laser, MD, Lurline Del, RN, Randa Spike,                            Technician Referring MD:             Delphina Cahill, MD Medicines:                Meperidine 50 mg IV, Midazolam 9 mg IV Complications:            No immediate complications. Estimated Blood Loss:     Estimated blood loss: none. Procedure:                Pre-Anesthesia Assessment:                           - Prior to the procedure, a History and Physical                            was performed, and patient medications and                            allergies were reviewed. The patient's tolerance of                            previous anesthesia was also reviewed. The risks                            and benefits of the procedure and the sedation                            options and risks were discussed with the patient.                            All questions were answered, and informed consent                            was obtained. Prior Anticoagulants: The patient has                            taken no previous anticoagulant or antiplatelet                            agents. ASA Grade Assessment: II - A patient with                            mild systemic disease. After reviewing the risks  and benefits, the patient was deemed in                            satisfactory condition to undergo the procedure.                           After obtaining informed consent, the colonoscope                            was passed under direct vision. Throughout the                            procedure, the patient's blood pressure, pulse, and              oxygen saturations were monitored continuously. The                            [id number]) was introduced through the                            anus and advanced to the the cecum, identified by                            appendiceal orifice and ileocecal valve. The                            colonoscopy was performed without difficulty. The                            patient tolerated the procedure well. The quality                            of the bowel preparation was excellent. The                            ileocecal valve, appendiceal orifice, and rectum                            were photographed. Scope In: 7:47:25 AM Scope Out: 8:05:08 AM Scope Withdrawal Time: 0 hours 10 minutes 32 seconds  Total Procedure Duration: 0 hours 17 minutes 43 seconds  Findings:      The colon (entire examined portion) appeared normal.      Anal papilla(e) were hypertrophied. Impression:               - The entire examined colon is normal.                           - Single large pedunculated anal papilla.                           - No specimens collected. Moderate Sedation:      Moderate (conscious) sedation was administered by the endoscopy nurse       and supervised by the endoscopist. The following parameters were       monitored: oxygen saturation, heart rate,  blood pressure, CO2       capnography and response to care. Total physician intraservice time was       24 minutes. Recommendation:           - Patient has a contact number available for                            emergencies. The signs and symptoms of potential                            delayed complications were discussed with the                            patient. Return to normal activities tomorrow.                            Written discharge instructions were provided to the                            patient.                           - Resume previous diet today.                           - Continue  present medications.                           - Repeat colonoscopy in 10 years for screening                            purposes. Procedure Code(s):        --- Professional ---                           [phone], Colonoscopy, flexible; diagnostic, including                            collection of specimen(s) by brushing or washing,                            when performed (separate procedure)                           99152, Moderate sedation services provided by the                            same physician or other qualified health care                            professional performing the diagnostic or                            therapeutic service that the sedation supports,                            requiring  the presence of an independent trained                            observer to assist in the monitoring of the                            patient's level of consciousness and physiological                            status; initial 15 minutes of intraservice time,                            patient age 71 years or older                           [phone], Moderate sedation services; each additional                            15 minutes intraservice time Diagnosis Code(s):        --- Professional ---                           Z12.11, Encounter for screening for malignant                            neoplasm of colon                           K62.89, Other specified diseases of anus and rectum CPT copyright 2016 [employer]. All rights reserved. The codes documented in this report are preliminary and upon coder review may  be revised to meet current compliance requirements. Hildred Laser, MD Hildred Laser, MD 01/06/2016 8:14:24 AM This report has been signed electronically. Number of Addenda: 0

## 2016-01-06 NOTE — H&P (Signed)
Karen Trujillo is an 50 y.o. female.   Chief Complaint: Patient is here for colonoscopy. HPI: Patient is 50 year old Caucasian female is here for screening colonoscopy. She denies abdominal pain change in bowel habits or rectal bleeding. Last colonoscopy was 10 years ago and she had one 5 years prior to that. Mother and sister have had colonic polyps. Maternal uncle had colon carcinoma in his 66s. Patient has history of breast CA diagnosed and treated in 2009. She remains in remission. She does complain of feeling of lump in liquid and throat every day. She is not having heartburn while on pantoprazole 40 mg he'll daily.  Past Medical History:  Diagnosis Date  . Breast cancer metastasized to axillary lymph node ([place]) 2009   Status post lumpectomy, axillary node dissection, chemotherapy, and radiation therapy.  . Candidal intertrigo 06/17/2011  . Cellulitis of breast 2011  . GERD (gastroesophageal reflux disease)   . Heart murmur   . History of migraine headaches   . Hypertension   . Red man syndrome 06/16/2011    Past Surgical History:  Procedure Laterality Date  . BREAST SURGERY  2009   Lumpectomy  . Hysterectomy and salpingo-oophorectomy  2009   Prophylactic for HER positive breast Ca  . TONSILLECTOMY AND ADENOIDECTOMY      History reviewed. No pertinent family history. Social History:  reports that she has never smoked. She has never used smokeless tobacco. She reports that she drinks alcohol. She reports that she does not use drugs.  Allergies: No Known Allergies  Medications Prior to Admission  Medication Sig Dispense Refill  . Biotin 1000 MCG tablet Take 1,000 mcg by mouth 3 (three) times daily.    . Cranberry 500 MG CHEW Chew by mouth.    . letrozole (FEMARA) 2.5 MG tablet Take 2.5 mg by mouth at bedtime.    Marland Kitchen lisinopril (PRINIVIL,ZESTRIL) 5 MG tablet Take 5 mg by mouth daily.    . Omega-3 Fatty Acids (FISH OIL) 1200 MG CAPS Take by mouth.    . pantoprazole (PROTONIX) 20  MG tablet Take 20 mg by mouth daily.    . polyethylene glycol-electrolytes (TRILYTE) 420 g solution Take 4,000 mLs by mouth once. 4000 mL 0  . ibuprofen (ADVIL,MOTRIN) 200 MG tablet Take 600-800 mg by mouth every 6 (six) hours as needed. Pain      No results found for this or any previous visit (from the past 48 hour(s)). No results found.  ROS  Blood pressure 119/87, pulse 83, temperature 98.5 F (36.9 C), temperature source Oral, resp. rate 14, height 5' 2.5" (1.588 m), weight 165 lb (74.8 kg), SpO2 99 %. Physical Exam  Constitutional: She appears well-developed and well-nourished.  HENT:  Mouth/Throat: Oropharynx is clear and moist.  Eyes: Conjunctivae are normal. No scleral icterus.  Neck: No thyromegaly present.  Cardiovascular: Normal rate and regular rhythm.   Murmur (faint SEM a left sternal border.) heard. Respiratory: Effort normal and breath sounds normal.  GI: Soft. She exhibits no distension and no mass. There is no tenderness.  Musculoskeletal: She exhibits no edema.  Lymphadenopathy:    She has no cervical adenopathy.  Neurological: She is alert.  Skin: Skin is warm and dry.     Assessment/Plan Average risk screening colonoscopy.  Hildred Laser, MD 01/06/2016, 7:37 AM

## 2016-01-06 NOTE — Discharge Instructions (Signed)
Increase pantoprazole to 40 mg by mouth 30 minutes before breakfast and evening meal daily. Resume other medications and diet as before. No driving for 24 hours. Please call office with progress report regarding GERD in 3 months. Next screening exam in 10 years.  Colonoscopy, Care After Refer to this sheet in the next few weeks. These instructions provide you with information on caring for yourself after your procedure. Your health care provider may also give you more specific instructions. Your treatment has been planned according to current medical practices, but problems sometimes occur. Call your health care provider if you have any problems or questions after your procedure. WHAT TO EXPECT AFTER THE PROCEDURE  After your procedure, it is typical to have the following:  A small amount of blood in your stool.  Moderate amounts of gas and mild abdominal cramping or bloating. HOME CARE INSTRUCTIONS  Do not drive, operate machinery, or sign important documents for 24 hours.  You may shower and resume your regular physical activities, but move at a slower pace for the first 24 hours.  Take frequent rest periods for the first 24 hours.  Walk around or put a warm pack on your abdomen to help reduce abdominal cramping and bloating.  Drink enough fluids to keep your urine clear or pale yellow.  You may resume your normal diet as instructed by your health care provider. Avoid heavy or fried foods that are hard to digest.  Avoid drinking alcohol for 24 hours or as instructed by your health care provider.  Only take over-the-counter or prescription medicines as directed by your health care provider.  If a tissue sample (biopsy) was taken during your procedure:  Do not take aspirin or blood thinners for 7 days, or as instructed by your health care provider.  Do not drink alcohol for 7 days, or as instructed by your health care provider.  Eat soft foods for the first 24 hours. SEEK  MEDICAL CARE IF: You have persistent spotting of blood in your stool 2-3 days after the procedure. SEEK IMMEDIATE MEDICAL CARE IF:  You have more than a small spotting of blood in your stool.  You pass large blood clots in your stool.  Your abdomen is swollen (distended).  You have nausea or vomiting.  You have a fever.  You have increasing abdominal pain that is not relieved with medicine.   This information is not intended to replace advice given to you by your health care provider. Make sure you discuss any questions you have with your health care provider.   Document Released: 12/07/2003 Document Revised: 02/12/2013 Document Reviewed: 12/30/2012 Elsevier Interactive Patient Education [employer].

## 2016-01-11 ENCOUNTER — Encounter (HOSPITAL_COMMUNITY): Payer: Self-pay | Admitting: Internal Medicine

## 2016-04-13 ENCOUNTER — Other Ambulatory Visit (INDEPENDENT_AMBULATORY_CARE_PROVIDER_SITE_OTHER): Payer: Self-pay | Admitting: Internal Medicine

## 2016-06-02 ENCOUNTER — Encounter (INDEPENDENT_AMBULATORY_CARE_PROVIDER_SITE_OTHER): Payer: Self-pay | Admitting: *Deleted

## 2016-06-02 ENCOUNTER — Encounter (INDEPENDENT_AMBULATORY_CARE_PROVIDER_SITE_OTHER): Payer: Self-pay | Admitting: Internal Medicine

## 2016-06-02 ENCOUNTER — Ambulatory Visit (INDEPENDENT_AMBULATORY_CARE_PROVIDER_SITE_OTHER): Payer: 59 | Admitting: Internal Medicine

## 2016-06-02 ENCOUNTER — Encounter (INDEPENDENT_AMBULATORY_CARE_PROVIDER_SITE_OTHER): Payer: Self-pay

## 2016-06-02 VITALS — BP 140/90 | HR 72 | Temp 97.4°F | Ht 63.0 in | Wt 180.8 lb

## 2016-06-02 DIAGNOSIS — R1313 Dysphagia, pharyngeal phase: Secondary | ICD-10-CM

## 2016-06-02 DIAGNOSIS — K219 Gastro-esophageal reflux disease without esophagitis: Secondary | ICD-10-CM | POA: Diagnosis not present

## 2016-06-02 NOTE — Progress Notes (Signed)
Subjective:    Patient ID: Karen Trujillo, female    DOB: March 08, 1966, 51 y.o.   MRN: [id number]  HPI Here today with c/o GERD. Takes Protonix BID for GERD.  She has tried Prilosec, Nexium. And Dexilant She tells me she feels like something is in the back of her throat. She has seen an ENT and exam was normal from his standpoint. If she runs out of the Protonix she has acid reflux. If she runs out she becomes bloating and she burps. She tells me she has hoarseness. She clears her throat constantly. She says she feels bloated. She has constipation. She has a BM every 3-4 days. She takes Senna at home as needed. Her appetite is good. No weight loss.  She occasionally has dysphagia. She sometimes chokes on liquids.   01/06/2015 Colonoscopy: screening  Impression:               - The entire examined colon is normal.                           - Single large pedunculated anal papilla.                           - No specimens collected.  Review of Systems Past Medical History:  Diagnosis Date  . Breast cancer metastasized to axillary lymph node ([place]) 2009   Status post lumpectomy, axillary node dissection, chemotherapy, and radiation therapy.  . Candidal intertrigo 06/17/2011  . Cellulitis of breast 2011  . GERD (gastroesophageal reflux disease)   . Heart murmur   . History of migraine headaches   . Hypertension   . Red man syndrome 06/16/2011    Past Surgical History:  Procedure Laterality Date  . BREAST SURGERY  2009   Lumpectomy  . COLONOSCOPY N/A 01/06/2016   Procedure: COLONOSCOPY;  Surgeon: Rogene Houston, MD;  [facility];  Service: Endoscopy;  Laterality: N/A;  7:30  . Hysterectomy and salpingo-oophorectomy  2009   Prophylactic for HER positive breast Ca  . TONSILLECTOMY AND ADENOIDECTOMY      No Known Allergies  Current Outpatient Prescriptions on File Prior to Visit  Medication Sig Dispense Refill  . Cranberry 500 MG CHEW Chew by mouth.    Marland Kitchen ibuprofen  (ADVIL,MOTRIN) 200 MG tablet Take 600-800 mg by mouth every 6 (six) hours as needed. Pain    . letrozole (FEMARA) 2.5 MG tablet Take 2.5 mg by mouth at bedtime.    . Omega-3 Fatty Acids (FISH OIL) 1200 MG CAPS Take by mouth.    . pantoprazole (PROTONIX) 20 MG tablet TAKE (1) TABLET BY MOUTH TWICE DAILY BEFORE MEALS. 60 tablet 5   No current facility-administered medications on file prior to visit.        Objective:   Physical Exam Blood pressure 140/90, pulse 72, temperature 97.4 F (36.3 C), height 5\' 3"  (1.6 m), weight 180 lb 12.8 oz (82 kg). Alert and oriented. Skin warm and dry. Oral mucosa is moist.   . Sclera anicteric, conjunctivae is pink. Thyroid not enlarged. No cervical lymphadenopathy. Lungs clear. Heart regular rate and rhythm.  Abdomen is soft. Bowel sounds are positive. No hepatomegaly. No abdominal masses felt. No tenderness.  No edema to lower extremities.        Assessment & Plan:  GERD. Continue the Protonix BID. Dysphagia: Am going to get an DG esophagram.  GERD diet  given to patient

## 2016-06-02 NOTE — Patient Instructions (Signed)
DG esophagram.  GERD diet.  

## 2016-06-06 ENCOUNTER — Ambulatory Visit (HOSPITAL_COMMUNITY)
Admission: RE | Admit: 2016-06-06 | Discharge: 2016-06-06 | Disposition: A | Discharge status: Home / Self Care | Payer: 59 | Source: Ambulatory Visit | Attending: Internal Medicine | Admitting: Internal Medicine

## 2016-06-06 DIAGNOSIS — R1313 Dysphagia, pharyngeal phase: Secondary | ICD-10-CM | POA: Diagnosis present

## 2016-06-19 ENCOUNTER — Encounter (INDEPENDENT_AMBULATORY_CARE_PROVIDER_SITE_OTHER): Payer: Self-pay | Admitting: Internal Medicine

## 2016-06-19 NOTE — Progress Notes (Signed)
Patient was given an appointment for 07/31/16 at 2:15pm with Deberah Castle, NP.

## 2016-07-31 ENCOUNTER — Ambulatory Visit (INDEPENDENT_AMBULATORY_CARE_PROVIDER_SITE_OTHER): Payer: 59 | Admitting: Internal Medicine

## 2016-11-06 ENCOUNTER — Other Ambulatory Visit (INDEPENDENT_AMBULATORY_CARE_PROVIDER_SITE_OTHER): Payer: Self-pay | Admitting: Internal Medicine

## 2017-03-06 ENCOUNTER — Other Ambulatory Visit (HOSPITAL_COMMUNITY): Payer: Self-pay | Admitting: Internal Medicine

## 2017-03-06 DIAGNOSIS — Z1231 Encounter for screening mammogram for malignant neoplasm of breast: Secondary | ICD-10-CM

## 2017-03-06 DIAGNOSIS — Z780 Asymptomatic menopausal state: Secondary | ICD-10-CM

## 2017-03-21 ENCOUNTER — Ambulatory Visit (HOSPITAL_COMMUNITY): Payer: 59

## 2017-03-21 ENCOUNTER — Other Ambulatory Visit (HOSPITAL_COMMUNITY): Payer: 59

## 2017-04-09 ENCOUNTER — Ambulatory Visit (HOSPITAL_COMMUNITY): Payer: 59

## 2017-04-13 ENCOUNTER — Ambulatory Visit (HOSPITAL_COMMUNITY)
Admission: RE | Admit: 2017-04-13 | Discharge: 2017-04-13 | Disposition: A | Discharge status: Home / Self Care | Payer: 59 | Source: Ambulatory Visit | Attending: Internal Medicine | Admitting: Internal Medicine

## 2017-04-13 ENCOUNTER — Encounter (HOSPITAL_COMMUNITY): Payer: Self-pay

## 2017-04-13 DIAGNOSIS — Z13820 Encounter for screening for osteoporosis: Secondary | ICD-10-CM | POA: Diagnosis not present

## 2017-04-13 DIAGNOSIS — Z1231 Encounter for screening mammogram for malignant neoplasm of breast: Secondary | ICD-10-CM | POA: Insufficient documentation

## 2017-04-13 DIAGNOSIS — Z780 Asymptomatic menopausal state: Secondary | ICD-10-CM | POA: Insufficient documentation

## 2017-04-13 HISTORY — DX: Personal history of irradiation: Z92.3

## 2017-04-13 HISTORY — DX: Personal history of antineoplastic chemotherapy: Z92.21

## 2017-04-19 ENCOUNTER — Other Ambulatory Visit (HOSPITAL_COMMUNITY): Payer: Self-pay | Admitting: Internal Medicine

## 2017-04-19 DIAGNOSIS — Z780 Asymptomatic menopausal state: Secondary | ICD-10-CM

## 2017-04-25 ENCOUNTER — Other Ambulatory Visit (HOSPITAL_COMMUNITY): Payer: 59

## 2017-04-27 ENCOUNTER — Other Ambulatory Visit (INDEPENDENT_AMBULATORY_CARE_PROVIDER_SITE_OTHER): Payer: Self-pay | Admitting: Internal Medicine

## 2017-11-15 ENCOUNTER — Other Ambulatory Visit (INDEPENDENT_AMBULATORY_CARE_PROVIDER_SITE_OTHER): Payer: Self-pay | Admitting: Internal Medicine

## 2018-01-24 ENCOUNTER — Encounter (INDEPENDENT_AMBULATORY_CARE_PROVIDER_SITE_OTHER): Payer: Self-pay

## 2018-02-05 ENCOUNTER — Encounter (INDEPENDENT_AMBULATORY_CARE_PROVIDER_SITE_OTHER): Payer: Self-pay | Admitting: Family Medicine

## 2018-02-05 ENCOUNTER — Ambulatory Visit (INDEPENDENT_AMBULATORY_CARE_PROVIDER_SITE_OTHER): Payer: 59 | Admitting: Family Medicine

## 2018-02-05 VITALS — BP 137/77 | HR 69 | Temp 97.8°F | Ht 63.0 in | Wt 183.0 lb

## 2018-02-05 DIAGNOSIS — E669 Obesity, unspecified: Secondary | ICD-10-CM

## 2018-02-05 DIAGNOSIS — R5383 Other fatigue: Secondary | ICD-10-CM

## 2018-02-05 DIAGNOSIS — R7303 Prediabetes: Secondary | ICD-10-CM | POA: Diagnosis not present

## 2018-02-05 DIAGNOSIS — Z9189 Other specified personal risk factors, not elsewhere classified: Secondary | ICD-10-CM | POA: Diagnosis not present

## 2018-02-05 DIAGNOSIS — Z0289 Encounter for other administrative examinations: Secondary | ICD-10-CM

## 2018-02-05 DIAGNOSIS — I10 Essential (primary) hypertension: Secondary | ICD-10-CM

## 2018-02-05 DIAGNOSIS — Z1331 Encounter for screening for depression: Secondary | ICD-10-CM | POA: Diagnosis not present

## 2018-02-05 DIAGNOSIS — R0602 Shortness of breath: Secondary | ICD-10-CM

## 2018-02-05 DIAGNOSIS — Z6832 Body mass index (BMI) 32.0-32.9, adult: Secondary | ICD-10-CM

## 2018-02-05 NOTE — Progress Notes (Signed)
.  Office: [phone]  /  Fax: [phone]   HPI:   Chief Complaint: OBESITY  Karen Trujillo (MR# [id number]) is a 52 y.o. female who presents on 02/05/2018 for obesity evaluation and treatment. Current BMI is Body mass index is 32.42 kg/m.Karen Trujillo has struggled with obesity for years and has been unsuccessful in either losing weight or maintaining long term weight loss. Karen Trujillo heard about our clinic from a friend. Karen Trujillo attended our information session and states she is currently in the action stage of change and ready to dedicate time achieving and maintaining a healthier weight.  Karen Trujillo states her family eats meals together she thinks her family will eat healthier with  her she struggles with family and or coworkers weight loss sabotage her desired weight loss is 50 lbs she started gaining weight post breast cancer and hysterectomy her heaviest weight ever was 190 lbs. she has significant food cravings issues  she snacks frequently in the evenings she skips meals frequently she is frequently drinking liquids with calories she frequently makes poor food choices she has binge eating behaviors she struggles with emotional eating    Fatigue Karen Trujillo feels her energy is lower than it should be. This has worsened with weight gain and has not worsened recently. Karen Trujillo admits to daytime somnolence and admits to waking up still tired. Patient is at risk for obstructive sleep apnea. Patent has a history of symptoms of daytime fatigue, morning fatigue, morning headache and hypertension. Patient generally gets 6 or 7 hours of sleep per night, and states they generally have restless sleep. Snoring is present. Apneic episodes are not present. Epworth Sleepiness Score is 14  EKG was ordered today and shows LVH with poor R wave progression.  Dyspnea on exertion Karen Trujillo notes increasing shortness of breath with exercising and seems to be worsening over time with weight gain. She notes  getting out of breath sooner with activity than she used to. This has not gotten worse recently. EKG was ordered today and shows LVH with poor R wave progression. Karen Trujillo denies orthopnea.  Hypertension Karen Trujillo is a 52 y.o. female with hypertension. She has had the diagnosis of hypertension for 4 to 5 years. Karen Trujillo denies chest pain, chest pressure or headache. She is working weight loss to help control her blood pressure with the goal of decreasing her risk of heart attack and stroke. Karen Trujillo blood pressure is currently controlled.  Pre-Diabetes Karen Trujillo has had a diagnosis of pre-diabetes for a couple of years and she was informed this puts her at greater risk of developing diabetes. She is on metformin currently and she denies any GI side effects. Karen Trujillo is attempting to work on diet and exercise to decrease risk of diabetes. She denies nausea or hypoglycemia.  At risk for diabetes Karen Trujillo is at higher than average risk for developing diabetes due to her obesity and pre-diabetes. She currently denies polyuria or polydipsia.  Depression Screen Karen Trujillo (modified PHQ-9) score was  Depression screen PHQ 2/9 02/05/2018  Decreased Interest 2  Down, Depressed, Hopeless 2  PHQ - 2 Score 4  Altered sleeping 1  Tired, decreased energy 3  Change in appetite 2  Feeling bad or failure about yourself  1  Trouble concentrating 2  Moving slowly or fidgety/restless 0  Suicidal thoughts 0  PHQ-9 Score 13  Difficult doing work/chores Not difficult at all    ALLERGIES: Allergies  Allergen Reactions  . Lisinopril Cough    MEDICATIONS: Current  Outpatient Medications on File Prior to Visit  Medication Sig Dispense Refill  . albuterol (PROVENTIL HFA;VENTOLIN HFA) 108 (90 Base) MCG/ACT inhaler Inhale into the lungs every 6 (six) hours as needed for wheezing or shortness of breath.    Marland Kitchen alendronate (FOSAMAX) 70 MG tablet Take 70 mg by mouth once a week. Take  with a full glass of water on an empty stomach.    . Calcium Carb-Cholecalciferol (CALCIUM 600+D3) 600-800 MG-UNIT TABS Take 1 tablet by mouth daily.    . Coenzyme Q10 (COQ10) 200 MG CAPS Take 1 tablet by mouth daily.    . Cranberry 500 MG CHEW Chew by mouth.    . hydrochlorothiazide (MICROZIDE) 12.5 MG capsule Take 25 mg by mouth daily.     Marland Kitchen ibuprofen (ADVIL,MOTRIN) 200 MG tablet Take 600-800 mg by mouth every 6 (six) hours as needed. Pain    . letrozole (FEMARA) 2.5 MG tablet Take 2.5 mg by mouth at bedtime.    . Magnesium 250 MG TABS Take 1 tablet by mouth daily.    . metFORMIN (GLUCOPHAGE) 500 MG tablet Take 500 mg by mouth daily with breakfast.    . pantoprazole (PROTONIX) 20 MG tablet TAKE (1) TABLET BY MOUTH TWICE DAILY BEFORE MEALS. 60 tablet 4  . rosuvastatin (CRESTOR) 5 MG tablet Take 5 mg by mouth daily.     No current facility-administered medications on file prior to visit.     PAST MEDICAL HISTORY: Past Medical History:  Diagnosis Date  . Asthma   . Back pain   . Breast cancer metastasized to axillary lymph node (Karen Trujillo) 2009   Status post lumpectomy, axillary node dissection, chemotherapy, and radiation therapy.  . Candidal intertrigo 06/17/2011  . Cellulitis of breast 2011  . GERD (gastroesophageal reflux disease)   . Heart murmur   . History of migraine headaches   . Hyperlipemia   . Hypertension   . Joint pain   . Osteopenia   . Personal history of chemotherapy   . Personal history of radiation therapy   . Prediabetes   . 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 BREAST LUMPECTOMY    . BREAST SURGERY  2009   Lumpectomy  . [place], 2001  . COLONOSCOPY N/A 01/06/2016   Procedure: COLONOSCOPY;  Surgeon: Rogene Houston, MD;  [facility];  Service: Endoscopy;  Laterality: N/A;  7:30  . Hysterectomy and salpingo-oophorectomy  2009   Prophylactic for HER positive breast Ca    . TONSILLECTOMY AND ADENOIDECTOMY      SOCIAL HISTORY: Social History   Tobacco Use  . Smoking status: Never Smoker  . Smokeless tobacco: Never Used  Substance Use Topics  . Alcohol use: Yes    Comment: Very rarely  . Drug use: No    FAMILY HISTORY: Family History  Problem Relation Age of Onset  . Hypertension Mother   . Hyperlipidemia Mother   . Anxiety disorder Mother   . Obesity Mother   . Hypertension Father   . Hyperlipidemia Father   . Heart disease Father     ROS: Review of Systems  Constitutional: Positive for malaise/fatigue.  HENT: Positive for hearing loss and tinnitus.        + Itchy Ears + Hay Fever + Hoarseness  Eyes:       + Wear Glasses or Contacts  Respiratory: Positive for shortness of breath (with activity).   Cardiovascular:  Positive for palpitations. Negative for chest pain.       Negative for chest pressure  Gastrointestinal: Positive for constipation. Negative for nausea.  Genitourinary: Negative for frequency.  Musculoskeletal: Positive for back pain and joint pain.       + Neck Stiffness   Skin: Positive for itching.       + Dryness   Neurological: Positive for headaches.  Endo/Heme/Allergies: Negative for polydipsia.       Negative for hypoglycemia  Psychiatric/Behavioral: The patient has insomnia.        + Stress    PHYSICAL EXAM: Blood pressure 137/77, pulse 69, temperature 97.8 F (36.6 C), temperature source Oral, height 5\' 3"  (1.6 m), weight 183 lb (83 kg), SpO2 97 %. Body mass index is 32.42 kg/m. Physical Exam  Constitutional: She is oriented to person, place, and time. She appears well-developed and well-nourished.  HENT:  Head: Normocephalic and atraumatic.  Nose: Nose normal.  Eyes: EOM are normal. No scleral icterus.  Neck: Normal range of motion. Neck supple. No thyromegaly present.  Cardiovascular: Normal rate.  Murmur (heard best at aortic area) heard.  Systolic murmur is present with a grade of  1/6. Pulmonary/Chest: Effort normal. No respiratory distress.  Abdominal: Soft. There is no tenderness.  + Obesity  Musculoskeletal: Normal range of motion.  Range of Motion normal in all 4 extremities  Neurological: She is alert and oriented to person, place, and time. Coordination normal.  Skin: Skin is warm and dry.  Psychiatric: She has a normal Trujillo and affect. Her behavior is normal.  Vitals reviewed.   RECENT LABS AND TESTS: BMET    Component Value Date/Time   NA 140 06/16/2011 0528   K 3.7 06/16/2011 0528   CL 108 06/16/2011 0528   CO2 22 06/16/2011 0528   GLUCOSE 99 06/16/2011 0528   BUN 12 06/16/2011 0528   CREATININE 0.72 06/16/2011 0528   CALCIUM 8.8 06/16/2011 0528   GFRNONAA >90 06/16/2011 0528   GFRAA >90 06/16/2011 0528   Lab Results  Component Value Date   HGBA1C 5.5 06/15/2011   No results found for: INSULIN CBC    Component Value Date/Time   WBC 5.9 06/16/2011 0528   RBC 4.00 06/16/2011 0528   HGB 11.8 (L) 06/16/2011 0528   HCT 34.3 (L) 06/16/2011 0528   PLT 154 06/16/2011 0528   MCV 85.8 06/16/2011 0528   MCH 29.5 06/16/2011 0528   MCHC 34.4 06/16/2011 0528   RDW 13.4 06/16/2011 0528   LYMPHSABS 0.8 06/15/2011 1838   MONOABS 0.3 06/15/2011 1838   EOSABS 0.1 06/15/2011 1838   BASOSABS 0.0 06/15/2011 1838   Iron/TIBC/Ferritin/ %Sat No results found for: IRON, TIBC, FERRITIN, IRONPCTSAT Lipid Panel  No results found for: CHOL, TRIG, HDL, CHOLHDL, VLDL, LDLCALC, LDLDIRECT Hepatic Function Panel     Component Value Date/Time   PROT 7.0 06/15/2011 1838   ALBUMIN 3.4 (L) 06/15/2011 1838   AST 29 06/15/2011 1838   ALT 39 (H) 06/15/2011 1838   ALKPHOS 72 06/15/2011 1838   BILITOT 0.4 06/15/2011 1838      Component Value Date/Time   TSH 1.370 06/15/2011 1838   Vitamin D There are no recent lab results  ECG  shows NSR with a rate of 72 BPM INDIRECT CALORIMETER done today shows a VO2 of 243 and a REE of 1688. Her calculated basal  metabolic rate is 1610 thus her basal metabolic rate is better than expected.    ASSESSMENT AND PLAN: Other fatigue -  Plan: EKG 12-Lead, Vitamin B12, CBC With Differential, Folate, Lipid Panel With LDL/HDL Ratio, T3, T4, free, TSH, VITAMIN D 25 Hydroxy (Vit-D Deficiency, Fractures)  Shortness of breath on exertion  Essential hypertension - Plan: Lipid Panel With LDL/HDL Ratio  Prediabetes - Plan: Comprehensive metabolic panel, Hemoglobin A1c, Insulin, random  Depression screening  At risk for diabetes mellitus  Class 1 obesity with serious comorbidity and body mass index (BMI) of 32.0 to 32.9 in adult, unspecified obesity type  PLAN:  Fatigue Karen Trujillo was informed that her fatigue may be related to obesity, depression or many other causes. Labs will be ordered, and in the meanwhile Karen Trujillo has agreed to work on diet, exercise and weight loss to help with fatigue. Proper sleep hygiene was discussed including the need for 7-8 hours of quality sleep each night. A sleep study was not ordered based on symptoms and Epworth score. We will order indirect calorimetry, EKG and echocardiogram.  Dyspnea on exertion Karen Trujillo shortness of breath appears to be obesity related and exercise induced. She has agreed to work on weight loss and gradually increase exercise to treat her exercise induced shortness of breath. If Leeya follows our instructions and loses weight without improvement of her shortness of breath, we will plan to refer to pulmonology. We will order labs, indirect calorimetry, EKG and echocardiogram. We will monitor this condition regularly. Karen Trujillo agrees to this plan.  Hypertension We discussed sodium restriction, working on healthy weight loss, and a regular exercise program as the means to achieve improved blood pressure control. Karen Trujillo agreed with this plan and agreed to follow up as directed. We will continue to monitor her blood pressure as well as her progress with the  above lifestyle modifications. She will continue her medications as prescribed and will watch for signs of hypotension as she continues her lifestyle modifications. We will order CMP, fasting lipid panel and EKG today.  Pre-Diabetes Karen Trujillo will continue to work on weight loss, exercise, and decreasing simple carbohydrates in her diet to help decrease the risk of diabetes. We dicussed metformin including benefits and risks. She was informed that eating too many simple carbohydrates or too many calories at one sitting increases the likelihood of GI side effects. Karen Trujillo will continue metformin and we will check Hgb A1c and insulin level today. Karen Trujillo agreed to follow up with [place] as directed to monitor her progress.  Diabetes risk counseling Karen Trujillo was given extended (15 minutes) diabetes prevention counseling today. She is 52 y.o. female and has risk factors for diabetes including obesity and pre-diabetes. We discussed intensive lifestyle modifications today with an emphasis on weight loss as well as increasing exercise and decreasing simple carbohydrates in her diet.  Depression Screen Karen Trujillo had a moderately positive depression screening. Depression is commonly associated with obesity and often results in emotional eating behaviors. We will monitor this closely and work on CBT to help improve the non-hunger eating patterns. Referral to Psychology may be required if no improvement is seen as she continues in our clinic.  Obesity Karen Trujillo is currently in the action stage of change and her goal is to continue with weight loss efforts She has agreed to follow the Category 2 plan Karen Trujillo has been instructed to work up to a goal of 150 minutes of combined cardio and strengthening exercise per week for weight loss and overall health benefits. We discussed the following Behavioral Modification Strategies today: planning for success, increasing lean protein intake, increasing vegetables and work on  meal planning and easy cooking  plans  Seham has agreed to follow up with our clinic in 2 weeks. She was informed of the importance of frequent follow up visits to maximize her success with intensive lifestyle modifications for her multiple health conditions. She was informed we would discuss her lab results at her next visit unless there is a critical issue that needs to be addressed sooner. Waynette agreed to keep her next visit at the agreed upon time to discuss these results.    OBESITY BEHAVIORAL INTERVENTION VISIT  Today's visit was # 1   Starting weight: 183 lbs Starting date: 02/05/18 Today's weight : 183 lbs  Today's date: 02/05/2018 Total lbs lost to date: 0   ASK: We discussed the diagnosis of obesity with Karen Trujillo today and Karen Trujillo agreed to give [place] permission to discuss obesity behavioral modification therapy today.  ASSESS: Lamekia has the diagnosis of obesity and her BMI today is 32.42 Karen Trujillo is in the action stage of change   ADVISE: Milanya was educated on the multiple health risks of obesity as well as the benefit of weight loss to improve her health. She was advised of the need for long term treatment and the importance of lifestyle modifications to improve her current health and to decrease her risk of future health problems.  AGREE: Multiple dietary modification options and treatment options were discussed and  L[place]na agreed to follow the recommendations documented in the above note.  ARRANGE: Manasi was educated on the importance of frequent visits to treat obesity as outlined per CMS and USPSTF guidelines and agreed to schedule her next follow up appointment today.   I, Doreene Nest, am acting as transcriptionist for Eber Jones, MD   I have reviewed the above documentation for accuracy and completeness, and I agree with the above. - Ilene Qua, MD

## 2018-02-06 LAB — COMPREHENSIVE METABOLIC PANEL
A/G RATIO: 2 (ref 1.2–2.2)
ALBUMIN: 4.7 g/dL (ref 3.5–5.5)
ALK PHOS: 74 IU/L (ref 39–117)
ALT: 55 IU/L — ABNORMAL HIGH (ref 0–32)
AST: 35 IU/L (ref 0–40)
BILIRUBIN TOTAL: 0.6 mg/dL (ref 0.0–1.2)
BUN / CREAT RATIO: 25 — AB (ref 9–23)
BUN: 17 mg/dL (ref 6–24)
CHLORIDE: 98 mmol/L (ref 96–106)
CO2: 25 mmol/L (ref 20–29)
Calcium: 9.5 mg/dL (ref 8.7–10.2)
Creatinine, Ser: 0.69 mg/dL (ref 0.57–1.00)
GFR calc Af Amer: 115 mL/min/{1.73_m2} (ref >59)
GFR calc non Af Amer: 100 mL/min/{1.73_m2} (ref >59)
GLOBULIN, TOTAL: 2.4 g/dL (ref 1.5–4.5)
GLUCOSE: 107 mg/dL — AB (ref 65–99)
Potassium: 3.4 mmol/L — ABNORMAL LOW (ref 3.5–5.2)
SODIUM: 141 mmol/L (ref 134–144)
Total Protein: 7.1 g/dL (ref 6.0–8.5)

## 2018-02-06 LAB — CBC WITH DIFFERENTIAL
BASOS ABS: 0 10*3/uL (ref 0.0–0.2)
Basos: 0 %
EOS (ABSOLUTE): 0.2 10*3/uL (ref 0.0–0.4)
Eos: 2 %
HEMATOCRIT: 40.8 % (ref 34.0–46.6)
Hemoglobin: 13.5 g/dL (ref 11.1–15.9)
Immature Grans (Abs): 0 10*3/uL (ref 0.0–0.1)
Immature Granulocytes: 0 %
LYMPHS ABS: 1.9 10*3/uL (ref 0.7–3.1)
Lymphs: 25 %
MCH: 29 pg (ref 26.6–33.0)
MCHC: 33.1 g/dL (ref 31.5–35.7)
MCV: 88 fL (ref 79–97)
Monocytes Absolute: 0.4 10*3/uL (ref 0.1–0.9)
Monocytes: 6 %
NEUTROS ABS: 5 10*3/uL (ref 1.4–7.0)
Neutrophils: 67 %
RBC: 4.65 x10E6/uL (ref 3.77–5.28)
RDW: 14.2 % (ref 12.3–15.4)
WBC: 7.4 10*3/uL (ref 3.4–10.8)

## 2018-02-06 LAB — T4, FREE: Free T4: 1.28 ng/dL (ref 0.82–1.77)

## 2018-02-06 LAB — T3: T3, Total: 171 ng/dL (ref 71–180)

## 2018-02-06 LAB — HEMOGLOBIN A1C
Est. average glucose Bld gHb Est-mCnc: 134 mg/dL
Hgb A1c MFr Bld: 6.3 % — ABNORMAL HIGH (ref 4.8–5.6)

## 2018-02-06 LAB — LIPID PANEL WITH LDL/HDL RATIO
Cholesterol, Total: 172 mg/dL (ref 100–199)
HDL: 44 mg/dL (ref >39)
LDL CALC: 103 mg/dL — AB (ref 0–99)
LDL/HDL RATIO: 2.3 ratio (ref 0.0–3.2)
Triglycerides: 123 mg/dL (ref 0–149)
VLDL CHOLESTEROL CAL: 25 mg/dL (ref 5–40)

## 2018-02-06 LAB — VITAMIN B12: Vitamin B-12: 437 pg/mL (ref 232–1245)

## 2018-02-06 LAB — INSULIN, RANDOM: INSULIN: 17.7 u[IU]/mL (ref 2.6–24.9)

## 2018-02-06 LAB — VITAMIN D 25 HYDROXY (VIT D DEFICIENCY, FRACTURES): Vit D, 25-Hydroxy: 51.5 ng/mL (ref 30.0–100.0)

## 2018-02-06 LAB — FOLATE: Folate: 8.1 ng/mL (ref >3.0)

## 2018-02-06 LAB — TSH: TSH: 1.77 u[IU]/mL (ref 0.450–4.500)

## 2018-02-07 ENCOUNTER — Encounter (INDEPENDENT_AMBULATORY_CARE_PROVIDER_SITE_OTHER): Payer: Self-pay | Admitting: Family Medicine

## 2018-02-13 ENCOUNTER — Encounter (INDEPENDENT_AMBULATORY_CARE_PROVIDER_SITE_OTHER): Payer: Self-pay | Admitting: Family Medicine

## 2018-02-20 ENCOUNTER — Other Ambulatory Visit: Payer: Self-pay

## 2018-02-20 ENCOUNTER — Ambulatory Visit (HOSPITAL_COMMUNITY): Payer: 59 | Attending: Cardiology

## 2018-02-20 ENCOUNTER — Encounter (INDEPENDENT_AMBULATORY_CARE_PROVIDER_SITE_OTHER): Payer: Self-pay | Admitting: Family Medicine

## 2018-02-20 ENCOUNTER — Ambulatory Visit (INDEPENDENT_AMBULATORY_CARE_PROVIDER_SITE_OTHER): Payer: 59 | Admitting: Family Medicine

## 2018-02-20 VITALS — BP 127/79 | HR 84 | Temp 98.5°F | Ht 63.0 in | Wt 175.0 lb

## 2018-02-20 DIAGNOSIS — E669 Obesity, unspecified: Secondary | ICD-10-CM | POA: Diagnosis not present

## 2018-02-20 DIAGNOSIS — R0602 Shortness of breath: Secondary | ICD-10-CM | POA: Insufficient documentation

## 2018-02-20 DIAGNOSIS — Z6831 Body mass index (BMI) 31.0-31.9, adult: Secondary | ICD-10-CM

## 2018-02-20 DIAGNOSIS — R7303 Prediabetes: Secondary | ICD-10-CM | POA: Diagnosis not present

## 2018-02-20 DIAGNOSIS — E7849 Other hyperlipidemia: Secondary | ICD-10-CM

## 2018-02-20 DIAGNOSIS — Z9189 Other specified personal risk factors, not elsewhere classified: Secondary | ICD-10-CM

## 2018-02-20 LAB — ECHOCARDIOGRAM COMPLETE
Height: 63 in
Weight: 2800 oz

## 2018-02-20 MED ORDER — METFORMIN HCL 500 MG PO TABS: 500.0000 mg | ORAL_TABLET | Freq: Every day | ORAL | 0 refills | Status: DC

## 2018-02-21 ENCOUNTER — Encounter (INDEPENDENT_AMBULATORY_CARE_PROVIDER_SITE_OTHER): Payer: Self-pay | Admitting: Family Medicine

## 2018-02-21 NOTE — Progress Notes (Signed)
Office: [phone]  /  Fax: [phone]   HPI:   Chief Complaint: OBESITY Karen Trujillo is here to discuss her progress with her obesity treatment plan. She is on the Category 2 plan and is following her eating plan approximately 85 % of the time. She states she is walking and doing Zumba for 30-60 minutes 3 times per week. Henretter is surprised with weight loss. She found meal plan easy to follow. She is still nervous about the quantity of bread. She notes being hungry the first few days, a little hunger between lunch and dinner. She notes snacking is only helping a little.  Her weight is 175 lb (79.4 kg) today and has had a weight loss of 8 pounds over a period of 2 weeks since her last visit. She has lost 8 lbs since starting treatment with [place].  Hyperlipidemia Zalaya has hyperlipidemia and has been trying to improve her cholesterol levels with intensive lifestyle modification including a low saturated fat diet, exercise and weight loss. LDL of 103, has been elevated for years. Last lab value of 85 in June 2019. She denies any chest pain, claudication or myalgias.  Pre-Diabetes Genasis has a diagnosis of pre-diabetes based on her elevated Hgb A1c and was informed this puts her at greater risk of developing diabetes. She is taking metformin with breakfast. Last Hgb A1c was 6.3 in June 2019. She denies GI side effects of metformin and continues to work on diet and exercise to decrease risk of diabetes. She denies hypoglycemia.  At risk for diabetes Tanika is at higher than average risk for developing diabetes due to her obesity and pre-diabetes. She currently denies polyuria or polydipsia.  ALLERGIES: Allergies  Allergen Reactions  . Lisinopril Cough    MEDICATIONS: Current Outpatient Medications on File Prior to Visit  Medication Sig Dispense Refill  . albuterol (PROVENTIL HFA;VENTOLIN HFA) 108 (90 Base) MCG/ACT inhaler Inhale into the lungs every 6 (six) hours as needed for  wheezing or shortness of breath.    Marland Kitchen alendronate (FOSAMAX) 70 MG tablet Take 70 mg by mouth once a week. Take with a full glass of water on an empty stomach.    . Calcium Carb-Cholecalciferol (CALCIUM 600+D3) 600-800 MG-UNIT TABS Take 1 tablet by mouth daily.    . Coenzyme Q10 (COQ10) 200 MG CAPS Take 1 tablet by mouth daily.    . Cranberry 500 MG CHEW Chew by mouth.    . hydrochlorothiazide (MICROZIDE) 12.5 MG capsule Take 25 mg by mouth daily.     Marland Kitchen ibuprofen (ADVIL,MOTRIN) 200 MG tablet Take 600-800 mg by mouth every 6 (six) hours as needed. Pain    . letrozole (FEMARA) 2.5 MG tablet Take 2.5 mg by mouth at bedtime.    . Magnesium 250 MG TABS Take 1 tablet by mouth daily.    . pantoprazole (PROTONIX) 20 MG tablet TAKE (1) TABLET BY MOUTH TWICE DAILY BEFORE MEALS. 60 tablet 4  . rosuvastatin (CRESTOR) 5 MG tablet Take 5 mg by mouth daily.     No current facility-administered medications on file prior to visit.     PAST MEDICAL HISTORY: Past Medical History:  Diagnosis Date  . Asthma   . Back pain   . Breast cancer metastasized to axillary lymph node ([place]) 2009   Status post lumpectomy, axillary node dissection, chemotherapy, and radiation therapy.  . Candidal intertrigo 06/17/2011  . Cellulitis of breast 2011  . GERD (gastroesophageal reflux disease)   . Heart murmur   . History of  migraine headaches   . Hyperlipemia   . Hypertension   . Joint pain   . Osteopenia   . Personal history of chemotherapy   . Personal history of radiation therapy   . Prediabetes   . 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 BREAST LUMPECTOMY    . BREAST SURGERY  2009   Lumpectomy  . [place], 2001  . COLONOSCOPY N/A 01/06/2016   Procedure: COLONOSCOPY;  Surgeon: Rogene Houston, MD;  Location: [facility];  Service: Endoscopy;  Laterality: N/A;  7:30  . Hysterectomy and salpingo-oophorectomy  2009   Prophylactic  for HER positive breast Ca  . TONSILLECTOMY AND ADENOIDECTOMY      SOCIAL HISTORY: Social History   Tobacco Use  . Smoking status: Never Smoker  . Smokeless tobacco: Never Used  Substance Use Topics  . Alcohol use: Yes    Comment: Very rarely  . Drug use: No    FAMILY HISTORY: Family History  Problem Relation Age of Onset  . Hypertension Mother   . Hyperlipidemia Mother   . Anxiety disorder Mother   . Obesity Mother   . Hypertension Father   . Hyperlipidemia Father   . Heart disease Father     ROS: Review of Systems  Constitutional: Positive for weight loss.  Cardiovascular: Negative for chest pain and claudication.  Genitourinary: Negative for frequency.  Musculoskeletal: Negative for myalgias.  Endo/Heme/Allergies: Negative for polydipsia.       Negative hypoglycemia    PHYSICAL EXAM: Blood pressure 127/79, pulse 84, temperature 98.5 F (36.9 C), temperature source Oral, height 5\' 3"  (1.6 m), weight 175 lb (79.4 kg), SpO2 97 %. Body mass index is 31 kg/m. Physical Exam  Constitutional: She is oriented to person, place, and time. She appears well-developed and well-nourished.  Cardiovascular: Normal rate.  Pulmonary/Chest: Effort normal.  Musculoskeletal: Normal range of motion.  Neurological: She is oriented to person, place, and time.  Skin: Skin is warm and dry.  Psychiatric: She has a normal mood and affect. Her behavior is normal.  Vitals reviewed.   RECENT LABS AND TESTS: BMET    Component Value Date/Time   NA 141 02/05/2018 1254   K 3.4 (L) 02/05/2018 1254   CL 98 02/05/2018 1254   CO2 25 02/05/2018 1254   GLUCOSE 107 (H) 02/05/2018 1254   GLUCOSE 99 06/16/2011 0528   BUN 17 02/05/2018 1254   CREATININE 0.69 02/05/2018 1254   CALCIUM 9.5 02/05/2018 1254   GFRNONAA 100 02/05/2018 1254   GFRAA 115 02/05/2018 1254   Lab Results  Component Value Date   HGBA1C 6.3 (H) 02/05/2018   HGBA1C 5.5 06/15/2011   Lab Results  Component Value Date     INSULIN 17.7 02/05/2018   CBC    Component Value Date/Time   WBC 7.4 02/05/2018 1254   WBC 5.9 06/16/2011 0528   RBC 4.65 02/05/2018 1254   RBC 4.00 06/16/2011 0528   HGB 13.5 02/05/2018 1254   HCT 40.8 02/05/2018 1254   PLT 154 06/16/2011 0528   MCV 88 02/05/2018 1254   MCH 29.0 02/05/2018 1254   MCH 29.5 06/16/2011 0528   MCHC 33.1 02/05/2018 1254   MCHC 34.4 06/16/2011 0528   RDW 14.2 02/05/2018 1254   LYMPHSABS 1.9 02/05/2018 1254   MONOABS 0.3 06/15/2011 1838   EOSABS 0.2 02/05/2018 1254   BASOSABS 0.0 02/05/2018 1254   Iron/TIBC/Ferritin/ %Sat No  results found for: IRON, TIBC, FERRITIN, IRONPCTSAT Lipid Panel     Component Value Date/Time   CHOL 172 02/05/2018 1254   TRIG 123 02/05/2018 1254   HDL 44 02/05/2018 1254   LDLCALC 103 (H) 02/05/2018 1254   Hepatic Function Panel     Component Value Date/Time   PROT 7.1 02/05/2018 1254   ALBUMIN 4.7 02/05/2018 1254   AST 35 02/05/2018 1254   ALT 55 (H) 02/05/2018 1254   ALKPHOS 74 02/05/2018 1254   BILITOT 0.6 02/05/2018 1254      Component Value Date/Time   TSH 1.770 02/05/2018 1254   TSH 1.370 06/15/2011 1838    ASSESSMENT AND PLAN: Other hyperlipidemia  Prediabetes - Plan: metFORMIN (GLUCOPHAGE) 500 MG tablet  At risk for diabetes mellitus  Class 1 obesity with serious comorbidity and body mass index (BMI) of 31.0 to 31.9 in adult, unspecified obesity type  PLAN:  Hyperlipidemia Jarah was informed of the American Heart Association Guidelines emphasizing intensive lifestyle modifications as the first line treatment for hyperlipidemia. We discussed many lifestyle modifications today in depth, and Annalina will continue to work on decreasing saturated fats such as fatty red meat, butter and many fried foods. She will also increase vegetables and lean protein in her diet and continue to work on exercise and weight loss efforts. We will repeat labs in 3 months. Emelyn agrees to follow up with our  clinic in 2 weeks.  Pre-Diabetes Duanna will continue to work on weight loss, exercise, and decreasing simple carbohydrates in her diet to help decrease the risk of diabetes. We dicussed metformin including benefits and risks. She was informed that eating too many simple carbohydrates or too many calories at one sitting increases the likelihood of GI side effects. Jailynn agrees to continue taking metformin 500 mg PO q AM #30 and we will refill for 1 month. We will repeat labs in 3 months. Harper agrees to follow up with our clinic in 2 weeks as directed to monitor her progress.  Diabetes risk counselling Aisley was given extended (30 minutes) diabetes prevention counseling today. She is 52 y.o. female and has risk factors for diabetes including obesity and pre-diabetes. We discussed intensive lifestyle modifications today with an emphasis on weight loss as well as increasing exercise and decreasing simple carbohydrates in her diet.  Obesity Castella is currently in the action stage of change. As such, her goal is to continue with weight loss efforts She has agreed to follow the Category 2 plan + 100 calories Suzzane has been instructed to work up to a goal of 150 minutes of combined cardio and strengthening exercise per week for weight loss and overall health benefits. We discussed the following Behavioral Modification Strategies today: increasing lean protein intake, increasing vegetables, work on meal planning and easy cooking plans, and planning for success   Chessa has agreed to follow up with our clinic in 2 weeks. She was informed of the importance of frequent follow up visits to maximize her success with intensive lifestyle modifications for her multiple health conditions.   OBESITY BEHAVIORAL INTERVENTION VISIT  Today's visit was # 2   Starting weight: 183 lbs Starting date: 02/05/18 Today's weight : 175 lbs  Today's date: 02/20/2018 Total lbs lost to date:  8    ASK: We discussed the diagnosis of obesity with [name] today and Joelene Millin agreed to give [place] permission to discuss obesity behavioral modification therapy today.  ASSESS: Shruti has the diagnosis of obesity and her BMI  today is 31.01 Annette is in the action stage of change   ADVISE: Gloriann was educated on the multiple health risks of obesity as well as the benefit of weight loss to improve her health. She was advised of the need for long term treatment and the importance of lifestyle modifications to improve her current health and to decrease her risk of future health problems.  AGREE: Multiple dietary modification options and treatment options were discussed and  Bridgitte agreed to follow the recommendations documented in the above note.  ARRANGE: Megin was educated on the importance of frequent visits to treat obesity as outlined per CMS and USPSTF guidelines and agreed to schedule her next follow up appointment today.  I, Trixie Dredge, am acting as transcriptionist for Ilene Qua, MD  I have reviewed the above documentation for accuracy and completeness, and I agree with the above. - Ilene Qua, MD

## 2018-02-26 ENCOUNTER — Encounter (INDEPENDENT_AMBULATORY_CARE_PROVIDER_SITE_OTHER): Payer: Self-pay | Admitting: Family Medicine

## 2018-02-28 ENCOUNTER — Encounter (INDEPENDENT_AMBULATORY_CARE_PROVIDER_SITE_OTHER): Payer: Self-pay

## 2018-03-04 ENCOUNTER — Encounter (INDEPENDENT_AMBULATORY_CARE_PROVIDER_SITE_OTHER): Payer: Self-pay | Admitting: Family Medicine

## 2018-03-04 ENCOUNTER — Ambulatory Visit (INDEPENDENT_AMBULATORY_CARE_PROVIDER_SITE_OTHER): Payer: 59 | Admitting: Family Medicine

## 2018-03-04 ENCOUNTER — Encounter (INDEPENDENT_AMBULATORY_CARE_PROVIDER_SITE_OTHER): Payer: Self-pay

## 2018-03-11 ENCOUNTER — Ambulatory Visit (INDEPENDENT_AMBULATORY_CARE_PROVIDER_SITE_OTHER): Payer: 59 | Admitting: Family Medicine

## 2018-03-11 ENCOUNTER — Encounter (INDEPENDENT_AMBULATORY_CARE_PROVIDER_SITE_OTHER): Payer: Self-pay | Admitting: Family Medicine

## 2018-03-11 VITALS — BP 123/81 | HR 72 | Temp 98.2°F | Ht 63.0 in | Wt 171.0 lb

## 2018-03-11 DIAGNOSIS — E7849 Other hyperlipidemia: Secondary | ICD-10-CM | POA: Diagnosis not present

## 2018-03-11 DIAGNOSIS — R7303 Prediabetes: Secondary | ICD-10-CM | POA: Diagnosis not present

## 2018-03-11 DIAGNOSIS — Z6830 Body mass index (BMI) 30.0-30.9, adult: Secondary | ICD-10-CM

## 2018-03-11 DIAGNOSIS — E669 Obesity, unspecified: Secondary | ICD-10-CM | POA: Diagnosis not present

## 2018-03-11 NOTE — Progress Notes (Signed)
Office: [phone]  /  Fax: [phone]   HPI:   Chief Complaint: OBESITY Karen Trujillo is here to discuss her progress with her obesity treatment plan. She is on the Category 2 plan + 100 calories and is following her eating plan approximately 90 % of the time. She states she is walking, doing Zumba, or weights 30 to 45 minutes 3 to 4 times per week. Karen Trujillo's work is very stressful. 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  Her weight is 171 lb (77.6 kg) today and has had a weight loss of 4 pounds over a period of 2 weeks since her last visit. She has lost 12 lbs since starting treatment with [place].  Pre-Diabetes Karen Trujillo has a diagnosis of pre-diabetes based on her elevated Hgb A1c and was informed this puts her at greater risk of developing diabetes. Her last A1c was 6.3 on 02/05/18. She is taking metformin currently and continues to work on diet and exercise to decrease risk of diabetes. She denies nausea, vomiting, or diarrhea.  Hyperlipidemia Karen Trujillo has hyperlipidemia and has been trying to improve her cholesterol levels with intensive lifestyle modification including a low saturated fat diet, exercise and weight loss. She [place] stable on Crestor 5mg . Her last LDL was 103 on 02/05/18. She denies any chest pain or claudication.  ALLERGIES: Allergies  Allergen Reactions  . Lisinopril Cough    MEDICATIONS: Current Outpatient Medications on File Prior to Visit  Medication Sig Dispense Refill  . albuterol (PROVENTIL HFA;VENTOLIN HFA) 108 (90 Base) MCG/ACT inhaler Inhale into the lungs every 6 (six) hours as needed for wheezing or shortness of breath.    Marland Kitchen alendronate (FOSAMAX) 70 MG tablet Take 70 mg by mouth once a week. Take with a full glass of water on an empty stomach.    . Calcium Carb-Cholecalciferol (CALCIUM 600+D3) 600-800 MG-UNIT TABS Take 1 tablet by mouth daily.    . Coenzyme Q10 (COQ10) 200 MG CAPS Take 1 tablet by mouth daily.    . Cranberry 500 MG CHEW Chew by  mouth.    . hydrochlorothiazide (MICROZIDE) 12.5 MG capsule Take 25 mg by mouth daily.     Marland Kitchen ibuprofen (ADVIL,MOTRIN) 200 MG tablet Take 600-800 mg by mouth every 6 (six) hours as needed. Pain    . letrozole (FEMARA) 2.5 MG tablet Take 2.5 mg by mouth at bedtime.    . Magnesium 250 MG TABS Take 1 tablet by mouth daily.    . metFORMIN (GLUCOPHAGE) 500 MG tablet Take 1 tablet (500 mg total) by mouth daily with breakfast. 30 tablet 0  . pantoprazole (PROTONIX) 20 MG tablet TAKE (1) TABLET BY MOUTH TWICE DAILY BEFORE MEALS. 60 tablet 4  . rosuvastatin (CRESTOR) 5 MG tablet Take 5 mg by mouth daily.     No current facility-administered medications on file prior to visit.     PAST MEDICAL HISTORY: Past Medical History:  Diagnosis Date  . Asthma   . Back pain   . Breast cancer metastasized to axillary lymph node ([place]) 2009   Status post lumpectomy, axillary node dissection, chemotherapy, and radiation therapy.  . Candidal intertrigo 06/17/2011  . Cellulitis of breast 2011  . GERD (gastroesophageal reflux disease)   . Heart murmur   . History of migraine headaches   . Hyperlipemia   . Hypertension   . Joint pain   . Osteopenia   . Personal history of chemotherapy   . Personal history of radiation therapy   . Prediabetes   .  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 BREAST LUMPECTOMY    . BREAST SURGERY  2009   Lumpectomy  . [place], 2001  . COLONOSCOPY N/A 01/06/2016   Procedure: COLONOSCOPY;  Surgeon: Rogene Houston, MD;  Location: [facility];  Service: Endoscopy;  Laterality: N/A;  7:30  . Hysterectomy and salpingo-oophorectomy  2009   Prophylactic for HER positive breast Ca  . TONSILLECTOMY AND ADENOIDECTOMY      SOCIAL HISTORY: Social History   Tobacco Use  . Smoking status: Never Smoker  . Smokeless tobacco: Never Used  Substance Use Topics  . Alcohol use: Yes    Comment: Very rarely    . Drug use: No    FAMILY HISTORY: Family History  Problem Relation Age of Onset  . Hypertension Mother   . Hyperlipidemia Mother   . Anxiety disorder Mother   . Obesity Mother   . Hypertension Father   . Hyperlipidemia Father   . Heart disease Father     ROS: Review of Systems  Constitutional: Positive for weight loss.  Cardiovascular: Negative for chest pain and claudication.  Gastrointestinal: Negative for diarrhea, nausea and vomiting.  Endo/Heme/Allergies:       Negative for polyphagia.    PHYSICAL EXAM: Blood pressure 123/81, pulse 72, temperature 98.2 F (36.8 C), temperature source Oral, height 5\' 3"  (1.6 m), weight 171 lb (77.6 kg), SpO2 97 %. Body mass index is 30.29 kg/m. Physical Exam  Constitutional: She is oriented to person, place, and time. She appears well-developed and well-nourished.  Cardiovascular: Normal rate.  Pulmonary/Chest: Effort normal.  Musculoskeletal: Normal range of motion.  Neurological: She is oriented to person, place, and time.  Skin: Skin is warm and dry.  Psychiatric: She has a normal mood and affect. Her behavior is normal.  Vitals reviewed.   RECENT LABS AND TESTS: BMET    Component Value Date/Time   NA 141 02/05/2018 1254   K 3.4 (L) 02/05/2018 1254   CL 98 02/05/2018 1254   CO2 25 02/05/2018 1254   GLUCOSE 107 (H) 02/05/2018 1254   GLUCOSE 99 06/16/2011 0528   BUN 17 02/05/2018 1254   CREATININE 0.69 02/05/2018 1254   CALCIUM 9.5 02/05/2018 1254   GFRNONAA 100 02/05/2018 1254   GFRAA 115 02/05/2018 1254   Lab Results  Component Value Date   HGBA1C 6.3 (H) 02/05/2018   HGBA1C 5.5 06/15/2011   Lab Results  Component Value Date   INSULIN 17.7 02/05/2018   CBC    Component Value Date/Time   WBC 7.4 02/05/2018 1254   WBC 5.9 06/16/2011 0528   RBC 4.65 02/05/2018 1254   RBC 4.00 06/16/2011 0528   HGB 13.5 02/05/2018 1254   HCT 40.8 02/05/2018 1254   PLT 154 06/16/2011 0528   MCV 88 02/05/2018 1254   MCH  29.0 02/05/2018 1254   MCH 29.5 06/16/2011 0528   MCHC 33.1 02/05/2018 1254   MCHC 34.4 06/16/2011 0528   RDW 14.2 02/05/2018 1254   LYMPHSABS 1.9 02/05/2018 1254   MONOABS 0.3 06/15/2011 1838   EOSABS 0.2 02/05/2018 1254   BASOSABS 0.0 02/05/2018 1254   Iron/TIBC/Ferritin/ %Sat No results found for: IRON, TIBC, FERRITIN, IRONPCTSAT Lipid Panel     Component Value Date/Time   CHOL 172 02/05/2018 1254   TRIG 123 02/05/2018 1254   HDL 44 02/05/2018 1254   LDLCALC 103 (H) 02/05/2018 1254   Hepatic Function  Panel     Component Value Date/Time   PROT 7.1 02/05/2018 1254   ALBUMIN 4.7 02/05/2018 1254   AST 35 02/05/2018 1254   ALT 55 (H) 02/05/2018 1254   ALKPHOS 74 02/05/2018 1254   BILITOT 0.6 02/05/2018 1254      Component Value Date/Time   TSH 1.770 02/05/2018 1254   TSH 1.370 06/15/2011 1838   Results for ICELA, GLYMPH (MRN [id number]) as of 03/11/2018 16:03  Ref. Range 02/05/2018 12:54  Vitamin D, 25-Hydroxy Latest Ref Range: 30.0 - 100.0 ng/mL 51.5   ASSESSMENT AND PLAN: Prediabetes  Other hyperlipidemia  Class 1 obesity with serious comorbidity and body mass index (BMI) of 30.0 to 30.9 in adult, unspecified obesity type  PLAN:  Pre-Diabetes Karen Trujillo will continue to work on weight loss, exercise, and decreasing simple carbohydrates in her diet to help decrease the risk of diabetes.  She was informed that eating too many simple carbohydrates or too many calories at one sitting increases the likelihood of GI side effects. Karen Trujillo agrees to continue metformin 500mg  qd and a prescription was not needed today. Karen Trujillo agreed to follow up with [place] as directed to monitor her progress in 2 weeks.  Hyperlipidemia Karen Trujillo was informed of the American Heart Association Guidelines emphasizing intensive lifestyle modifications as the first line treatment for hyperlipidemia. We discussed many lifestyle modifications today in depth, and Karen Trujillo will continue to work on  decreasing saturated fats such as fatty red meat, butter and many fried foods. She will also increase vegetables and lean protein in her diet and continue to work on exercise and weight loss efforts. She agrees to continue Crestor and her diet. Karen Trujillo agrees to follow up as directed.  I spent > than 50% of the 15 minute visit on counseling as documented in the note.  Obesity Karen Trujillo is currently in the action stage of change. As such, her goal is to continue with weight loss efforts. She has agreed to follow the Category 2 plan + 100 calories. Karen Trujillo has been instructed to continue walking, Zumba, and using weights 3 to 4 times a week.  We discussed the following Behavioral Modification Strategies today: increase H2O intake and planning for success.  Karen Trujillo has agreed to follow up with our clinic in 2 weeks. She was informed of the importance of frequent follow up visits to maximize her success with intensive lifestyle modifications for her multiple health conditions.   OBESITY BEHAVIORAL INTERVENTION VISIT  Today's visit was # 3   Starting weight: 183 lbs Starting date: 02/05/18 Today's weight : Weight: 171 lb (77.6 kg)  Today's date: 03/11/2018 Total lbs lost to date: 12  ASK: We discussed the diagnosis of obesity with Karen Trujillo today and Karen Trujillo agreed to give [place] permission to discuss obesity behavioral modification therapy today.  ASSESS: Karen Trujillo has the diagnosis of obesity and her BMI today is 30.3. Karen Trujillo is in the action stage of change.   ADVISE: Karen Trujillo was educated on the multiple health risks of obesity as well as the benefit of weight loss to improve her health. She was advised of the need for long term treatment and the importance of lifestyle modifications to improve her current health and to decrease her risk of future health problems.  AGREE: Multiple dietary modification options and treatment options were discussed and Karen Trujillo agreed to follow  the recommendations documented in the above note.  ARRANGE: Karen Trujillo was educated on the importance of frequent visits to treat obesity as outlined per CMS  and USPSTF guidelines and agreed to schedule her next follow up appointment today.  Lenward Chancellor, am acting as Location manager for Georgianne Fick, FNP. I have reviewed the above documentation for accuracy and completeness, and I agree with the above.  - Martia Dalby, FNP-C.

## 2018-03-12 ENCOUNTER — Encounter (INDEPENDENT_AMBULATORY_CARE_PROVIDER_SITE_OTHER): Payer: Self-pay | Admitting: Family Medicine

## 2018-03-20 ENCOUNTER — Encounter (INDEPENDENT_AMBULATORY_CARE_PROVIDER_SITE_OTHER): Payer: Self-pay | Admitting: Family Medicine

## 2018-03-25 ENCOUNTER — Ambulatory Visit (INDEPENDENT_AMBULATORY_CARE_PROVIDER_SITE_OTHER): Payer: 59 | Admitting: Family Medicine

## 2018-03-25 VITALS — BP 134/85 | HR 86 | Temp 98.0°F | Ht 63.0 in | Wt 172.0 lb

## 2018-03-25 DIAGNOSIS — K5900 Constipation, unspecified: Secondary | ICD-10-CM

## 2018-03-25 DIAGNOSIS — E669 Obesity, unspecified: Secondary | ICD-10-CM

## 2018-03-25 DIAGNOSIS — R7303 Prediabetes: Secondary | ICD-10-CM

## 2018-03-25 DIAGNOSIS — Z9189 Other specified personal risk factors, not elsewhere classified: Secondary | ICD-10-CM | POA: Diagnosis not present

## 2018-03-25 DIAGNOSIS — Z6830 Body mass index (BMI) 30.0-30.9, adult: Secondary | ICD-10-CM

## 2018-03-25 DIAGNOSIS — E66811 Obesity, class 1: Secondary | ICD-10-CM

## 2018-03-27 ENCOUNTER — Encounter (INDEPENDENT_AMBULATORY_CARE_PROVIDER_SITE_OTHER): Payer: Self-pay | Admitting: Family Medicine

## 2018-03-27 MED ORDER — METFORMIN HCL 500 MG PO TABS: 500.0000 mg | ORAL_TABLET | Freq: Every day | ORAL | 0 refills | Status: DC

## 2018-03-27 NOTE — Progress Notes (Signed)
Office: [phone]  /  Fax: [phone]   HPI:   Chief Complaint: OBESITY Karen Trujillo is here to discuss her progress with her obesity treatment plan. She is on the Category 2 plan + 100 calories and is following her eating plan approximately 85 % of the time. She states she is walking 60 minutes 2 times per week. Karen Trujillo recently on a weekend trip and stayed on the plan fairly well.  Her weight is 172 lb (78 kg) today and has had a weight loss of 1 pound over a period of 2 weeks since her last visit. She has lost 11 lbs since starting treatment with [place].  Pre-Diabetes Karen Trujillo has a diagnosis of pre diabetes based on her elevated Hgb A1c and was informed this puts her at greater risk of developing diabetes. Her last A1c was 5.8 on 03/12/18 which was down from 6.3. She is taking metformin currently and denies nausea, vomiting, and diarrhea. She continues to work on diet and exercise to decrease risk of diabetes. She denies polyphagia or hypoglycemia.  At risk for diabetes Karen Trujillo is at higher than average risk for developing diabetes due to her pre-diabetes and obesity. She currently denies polyuria or polydipsia.  Constipation Karen Trujillo notes constipation for the last few weeks, worse since attempting weight loss. She states her BM's are less frequent and  She denies hematochezia or melena. She admits to drinking less H20 recently.  ALLERGIES: Allergies  Allergen Reactions  . Lisinopril Cough    MEDICATIONS: Current Outpatient Medications on File Prior to Visit  Medication Sig Dispense Refill  . albuterol (PROVENTIL HFA;VENTOLIN HFA) 108 (90 Base) MCG/ACT inhaler Inhale into the lungs every 6 (six) hours as needed for wheezing or shortness of breath.    Marland Kitchen alendronate (FOSAMAX) 70 MG tablet Take 70 mg by mouth once a week. Take with a full glass of water on an empty stomach.    . Calcium Carb-Cholecalciferol (CALCIUM 600+D3) 600-800 MG-UNIT TABS Take 1 tablet by mouth daily.    .  Coenzyme Q10 (COQ10) 200 MG CAPS Take 1 tablet by mouth daily.    . Cranberry 500 MG CHEW Chew by mouth.    . hydrochlorothiazide (MICROZIDE) 12.5 MG capsule Take 25 mg by mouth daily.     Marland Kitchen ibuprofen (ADVIL,MOTRIN) 200 MG tablet Take 600-800 mg by mouth every 6 (six) hours as needed. Pain    . letrozole (FEMARA) 2.5 MG tablet Take 2.5 mg by mouth at bedtime.    . Magnesium 250 MG TABS Take 1 tablet by mouth daily.    . metFORMIN (GLUCOPHAGE) 500 MG tablet Take 1 tablet (500 mg total) by mouth daily with breakfast. 30 tablet 0  . pantoprazole (PROTONIX) 20 MG tablet TAKE (1) TABLET BY MOUTH TWICE DAILY BEFORE MEALS. 60 tablet 4  . rosuvastatin (CRESTOR) 5 MG tablet Take 5 mg by mouth daily.     No current facility-administered medications on file prior to visit.     PAST MEDICAL HISTORY: Past Medical History:  Diagnosis Date  . Asthma   . Back pain   . Breast cancer metastasized to axillary lymph node ([place]) 2009   Status post lumpectomy, axillary node dissection, chemotherapy, and radiation therapy.  . Candidal intertrigo 06/17/2011  . Cellulitis of breast 2011  . GERD (gastroesophageal reflux disease)   . Heart murmur   . History of migraine headaches   . Hyperlipemia   . Hypertension   . Joint pain   . Osteopenia   . Personal  history of chemotherapy   . Personal history of radiation therapy   . Prediabetes   . 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 BREAST LUMPECTOMY    . BREAST SURGERY  2009   Lumpectomy  . [place], 2001  . COLONOSCOPY N/A 01/06/2016   Procedure: COLONOSCOPY;  Surgeon: Rogene Houston, MD;  Location: [facility];  Service: Endoscopy;  Laterality: N/A;  7:30  . Hysterectomy and salpingo-oophorectomy  2009   Prophylactic for HER positive breast Ca  . TONSILLECTOMY AND ADENOIDECTOMY      SOCIAL HISTORY: Social History   Tobacco Use  . Smoking status: Never Smoker  .  Smokeless tobacco: Never Used  Substance Use Topics  . Alcohol use: Yes    Comment: Very rarely  . Drug use: No    FAMILY HISTORY: Family History  Problem Relation Age of Onset  . Hypertension Mother   . Hyperlipidemia Mother   . Anxiety disorder Mother   . Obesity Mother   . Hypertension Father   . Hyperlipidemia Father   . Heart disease Father     ROS: Review of Systems  Constitutional: Negative for weight loss.  Gastrointestinal: Positive for constipation. Negative for diarrhea, melena, nausea and vomiting.       Negative for hematochezia.  Genitourinary:       Negative for polyuria.  Endo/Heme/Allergies: Negative for polydipsia.       Negative for polyphagia. Negative for hypoglycemia.    PHYSICAL EXAM: Blood pressure 134/85, pulse 86, temperature 98 F (36.7 C), temperature source Oral, height 5\' 3"  (1.6 m), weight 172 lb (78 kg), SpO2 97 %. Body mass index is 30.47 kg/m. Physical Exam  Constitutional: She is oriented to person, place, and time. She appears well-developed and well-nourished.  Cardiovascular: Normal rate.  Pulmonary/Chest: Effort normal.  Musculoskeletal: Normal range of motion.  Neurological: She is oriented to person, place, and time.  Skin: Skin is warm and dry.  Psychiatric: She has a normal mood and affect. Her behavior is normal.  Vitals reviewed.   RECENT LABS AND TESTS: BMET    Component Value Date/Time   NA 141 02/05/2018 1254   K 3.4 (L) 02/05/2018 1254   CL 98 02/05/2018 1254   CO2 25 02/05/2018 1254   GLUCOSE 107 (H) 02/05/2018 1254   GLUCOSE 99 06/16/2011 0528   BUN 17 02/05/2018 1254   CREATININE 0.69 02/05/2018 1254   CALCIUM 9.5 02/05/2018 1254   GFRNONAA 100 02/05/2018 1254   GFRAA 115 02/05/2018 1254   Lab Results  Component Value Date   HGBA1C 6.3 (H) 02/05/2018   HGBA1C 5.5 06/15/2011   Lab Results  Component Value Date   INSULIN 17.7 02/05/2018   CBC    Component Value Date/Time   WBC 7.4 02/05/2018  1254   WBC 5.9 06/16/2011 0528   RBC 4.65 02/05/2018 1254   RBC 4.00 06/16/2011 0528   HGB 13.5 02/05/2018 1254   HCT 40.8 02/05/2018 1254   PLT 154 06/16/2011 0528   MCV 88 02/05/2018 1254   MCH 29.0 02/05/2018 1254   MCH 29.5 06/16/2011 0528   MCHC 33.1 02/05/2018 1254   MCHC 34.4 06/16/2011 0528   RDW 14.2 02/05/2018 1254   LYMPHSABS 1.9 02/05/2018 1254   MONOABS 0.3 06/15/2011 1838   EOSABS 0.2 02/05/2018 1254   BASOSABS 0.0 02/05/2018 1254   Iron/TIBC/Ferritin/ %Sat No results found for: IRON, TIBC, FERRITIN,  IRONPCTSAT Lipid Panel     Component Value Date/Time   CHOL 172 02/05/2018 1254   TRIG 123 02/05/2018 1254   HDL 44 02/05/2018 1254   LDLCALC 103 (H) 02/05/2018 1254   Hepatic Function Panel     Component Value Date/Time   PROT 7.1 02/05/2018 1254   ALBUMIN 4.7 02/05/2018 1254   AST 35 02/05/2018 1254   ALT 55 (H) 02/05/2018 1254   ALKPHOS 74 02/05/2018 1254   BILITOT 0.6 02/05/2018 1254      Component Value Date/Time   TSH 1.770 02/05/2018 1254   TSH 1.370 06/15/2011 1838   Results for CHARRISE, LARDNER (MRN [id number]) as of 03/27/2018 06:41  Ref. Range 02/05/2018 12:54  Vitamin D, 25-Hydroxy Latest Ref Range: 30.0 - 100.0 ng/mL 51.5   ASSESSMENT AND PLAN: Prediabetes  Constipation, unspecified constipation type  At risk for diabetes mellitus  Class 1 obesity with serious comorbidity and body mass index (BMI) of 30.0 to 30.9 in adult, unspecified obesity type  PLAN:  Pre-Diabetes Sumiye will continue to work on weight loss, exercise, and decreasing simple carbohydrates in her diet to help decrease the risk of diabetes. She was informed that eating too many simple carbohydrates or too many calories at one sitting increases the likelihood of GI side effects. Marleena agreed to continue her diet and taking metformin for now and a prescription was not written today. Stephan agreed to follow up with [place] as directed to monitor her progress in 2  weeks.  Diabetes risk counseling Kleo was given extended (15 minutes) diabetes prevention counseling today. She is 52 y.o. female and has risk factors for diabetes including pre-diabetes and obesity. We discussed intensive lifestyle modifications today with an emphasis on weight loss as well as increasing exercise and decreasing simple carbohydrates in her diet.  Constipation Nichelle was informed decrease bowel movement frequency is normal while losing weight, but stools should not be hard or painful. She was advised to increase her H20 intake and work on increasing her fiber intake. High fiber foods were discussed today. She will try Metamucil or Citracel. If that doesn't work, she may use Miralax daily and follow up as directed.  Obesity Marbeth is currently in the action stage of change. As such, her goal is to continue with weight loss efforts. She has agreed to follow the Category 2 plan + 100 calories. Perle has been instructed to work up to a goal of 150 minutes of combined cardio and strengthening exercise per week for weight loss and overall health benefits. We discussed the following Behavioral Modification Strategies today: increase H2O intake, travel eating strategies, holiday eating strategies, and planning for success.  Malayiah has agreed to follow up with our clinic in 2 weeks. She was informed of the importance of frequent follow up visits to maximize her success with intensive lifestyle modifications for her multiple health conditions.   OBESITY BEHAVIORAL INTERVENTION VISIT  Today's visit was # 3   Starting weight: 183 lbs Starting date: 02/05/18 Today's weight : Weight: 172 lb (78 kg)  Today's date: 03/25/2018 Total lbs lost to date: 11  ASK: We discussed the diagnosis of obesity with [name] today and Joelene Millin agreed to give [place] permission to discuss obesity behavioral modification therapy today.  ASSESS: Jameson has the diagnosis of obesity and  her BMI today is 30.48. Elaysia is in the action stage of change.   ADVISE: Karen Trujillo was educated on the multiple health risks of obesity as well as the benefit  of weight loss to improve her health. She was advised of the need for long term treatment and the importance of lifestyle modifications to improve her current health and to decrease her risk of future health problems.  AGREE: Multiple dietary modification options and treatment options were discussed and Karen Trujillo agreed to follow the recommendations documented in the above note.  ARRANGE: Sherri was educated on the importance of frequent visits to treat obesity as outlined per CMS and USPSTF guidelines and agreed to schedule her next follow up appointment today.  Lenward Chancellor, am acting as Location manager for Georgianne Fick, FNP.  I have reviewed the above documentation for accuracy and completeness, and I agree with the above.  - Newell Frater, FNP-C.

## 2018-03-28 ENCOUNTER — Encounter (INDEPENDENT_AMBULATORY_CARE_PROVIDER_SITE_OTHER): Payer: Self-pay | Admitting: Family Medicine

## 2018-04-02 ENCOUNTER — Encounter (INDEPENDENT_AMBULATORY_CARE_PROVIDER_SITE_OTHER): Payer: Self-pay | Admitting: Family Medicine

## 2018-04-02 MED ORDER — METFORMIN HCL 500 MG PO TABS: 500.0000 mg | ORAL_TABLET | Freq: Two times a day (BID) | ORAL | 0 refills | Status: AC

## 2018-04-02 NOTE — Telephone Encounter (Signed)
Please address

## 2018-04-03 ENCOUNTER — Encounter (INDEPENDENT_AMBULATORY_CARE_PROVIDER_SITE_OTHER): Payer: Self-pay | Admitting: Family Medicine

## 2018-04-11 ENCOUNTER — Ambulatory Visit (INDEPENDENT_AMBULATORY_CARE_PROVIDER_SITE_OTHER): Payer: 59 | Admitting: Family Medicine

## 2018-07-02 ENCOUNTER — Other Ambulatory Visit (HOSPITAL_COMMUNITY): Payer: Self-pay | Admitting: Internal Medicine

## 2018-07-02 DIAGNOSIS — Z1231 Encounter for screening mammogram for malignant neoplasm of breast: Secondary | ICD-10-CM

## 2018-07-25 ENCOUNTER — Ambulatory Visit (HOSPITAL_COMMUNITY): Payer: 59

## 2018-09-04 ENCOUNTER — Ambulatory Visit (HOSPITAL_COMMUNITY): Payer: Self-pay

## 2018-09-06 ENCOUNTER — Encounter (INDEPENDENT_AMBULATORY_CARE_PROVIDER_SITE_OTHER): Payer: Self-pay | Admitting: Family Medicine

## 2018-09-24 ENCOUNTER — Encounter (INDEPENDENT_AMBULATORY_CARE_PROVIDER_SITE_OTHER): Payer: Self-pay | Admitting: Family Medicine

## 2018-10-25 ENCOUNTER — Ambulatory Visit (HOSPITAL_COMMUNITY)
Admission: RE | Admit: 2018-10-25 | Discharge: 2018-10-25 | Disposition: A | Discharge status: Home / Self Care | Payer: Self-pay | Source: Ambulatory Visit | Attending: Internal Medicine | Admitting: Internal Medicine

## 2018-10-25 ENCOUNTER — Other Ambulatory Visit: Payer: Self-pay

## 2018-10-25 DIAGNOSIS — Z1231 Encounter for screening mammogram for malignant neoplasm of breast: Secondary | ICD-10-CM | POA: Insufficient documentation

## 2019-12-15 ENCOUNTER — Other Ambulatory Visit (HOSPITAL_COMMUNITY): Payer: Self-pay | Admitting: Adult Health Nurse Practitioner

## 2019-12-15 DIAGNOSIS — Z1231 Encounter for screening mammogram for malignant neoplasm of breast: Secondary | ICD-10-CM

## 2020-01-09 ENCOUNTER — Other Ambulatory Visit: Payer: Self-pay

## 2020-01-09 ENCOUNTER — Ambulatory Visit (HOSPITAL_COMMUNITY)
Admission: RE | Admit: 2020-01-09 | Discharge: 2020-01-09 | Disposition: A | Discharge status: Home / Self Care | Payer: Self-pay | Source: Ambulatory Visit | Attending: Adult Health Nurse Practitioner | Admitting: Adult Health Nurse Practitioner

## 2020-01-09 DIAGNOSIS — Z1231 Encounter for screening mammogram for malignant neoplasm of breast: Secondary | ICD-10-CM | POA: Insufficient documentation

## 2020-01-16 ENCOUNTER — Other Ambulatory Visit (HOSPITAL_COMMUNITY): Payer: Self-pay | Admitting: Internal Medicine

## 2020-01-16 DIAGNOSIS — R928 Other abnormal and inconclusive findings on diagnostic imaging of breast: Secondary | ICD-10-CM

## 2020-01-23 ENCOUNTER — Other Ambulatory Visit: Payer: Self-pay

## 2020-01-23 ENCOUNTER — Ambulatory Visit (HOSPITAL_COMMUNITY)
Admission: RE | Admit: 2020-01-23 | Discharge: 2020-01-23 | Disposition: A | Discharge status: Home / Self Care | Payer: Self-pay | Source: Ambulatory Visit | Attending: Internal Medicine | Admitting: Internal Medicine

## 2020-01-23 ENCOUNTER — Encounter (HOSPITAL_COMMUNITY): Payer: Self-pay

## 2020-01-23 DIAGNOSIS — R928 Other abnormal and inconclusive findings on diagnostic imaging of breast: Secondary | ICD-10-CM

## 2021-03-23 ENCOUNTER — Other Ambulatory Visit: Payer: Self-pay | Admitting: Family Medicine

## 2021-03-23 DIAGNOSIS — Z1231 Encounter for screening mammogram for malignant neoplasm of breast: Secondary | ICD-10-CM

## 2021-05-06 ENCOUNTER — Ambulatory Visit
Admission: RE | Admit: 2021-05-06 | Discharge: 2021-05-06 | Disposition: A | Discharge status: Home / Self Care | Payer: No Typology Code available for payment source | Source: Ambulatory Visit | Attending: Family Medicine | Admitting: Family Medicine

## 2021-05-06 DIAGNOSIS — Z1231 Encounter for screening mammogram for malignant neoplasm of breast: Secondary | ICD-10-CM

## 2021-08-17 IMAGING — US US BREAST*R* LIMITED INC AXILLA
1 series · 3 of 3 positions shown · non-contrast
Comparison: Previous exams.

CLINICAL DATA: Screening recall for possible right breast mass.

EXAM:
DIGITAL DIAGNOSTIC UNILATERAL RIGHT MAMMOGRAM WITH TOMO AND CAD;
ULTRASOUND RIGHT BREAST LIMITED

[Series 1: us breast*right* limited inc axilla · 0.07mm/px · 3 of 3 slices shown]
[im 1/3]
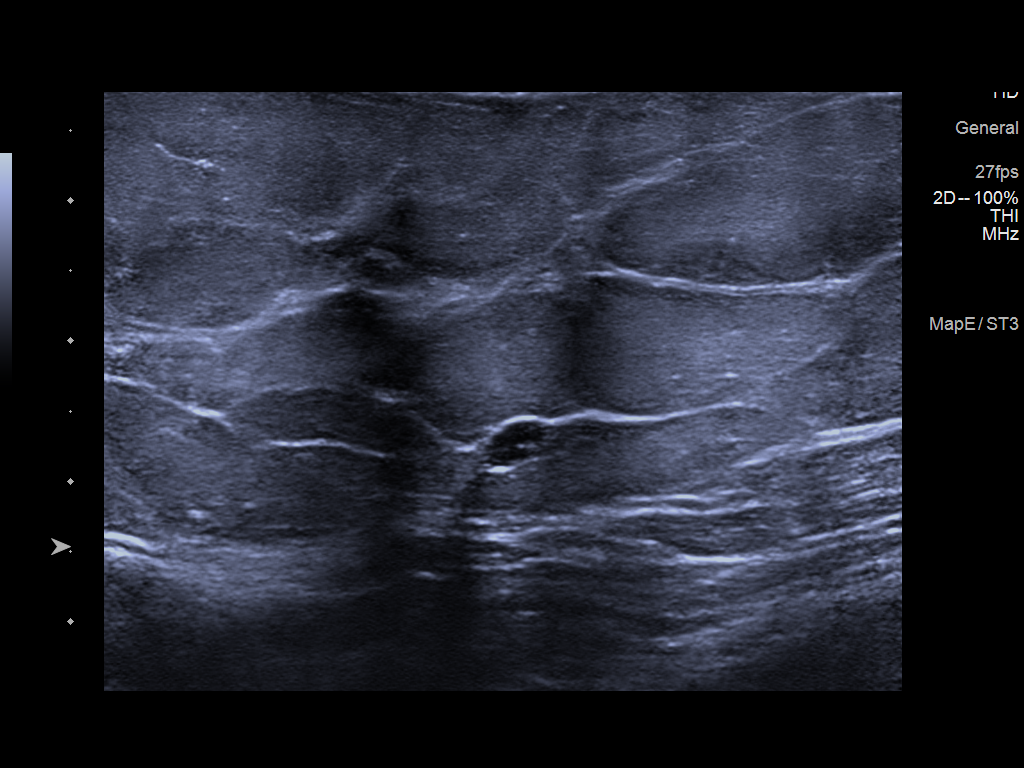
[im 2/3]
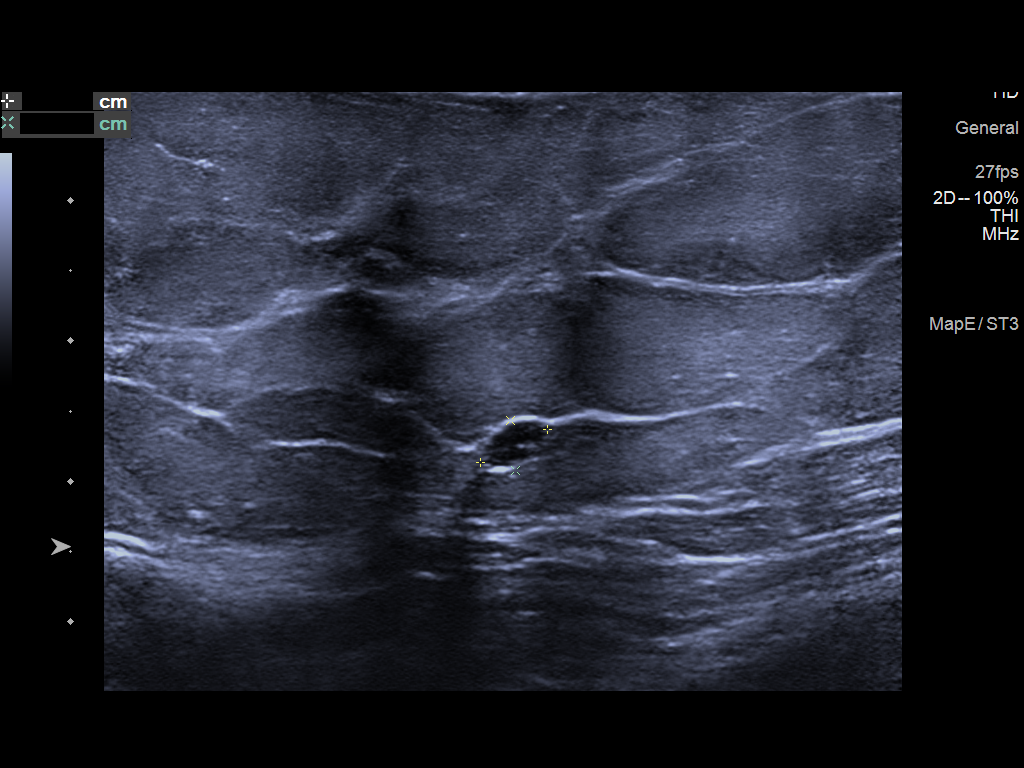
[im 3/3]
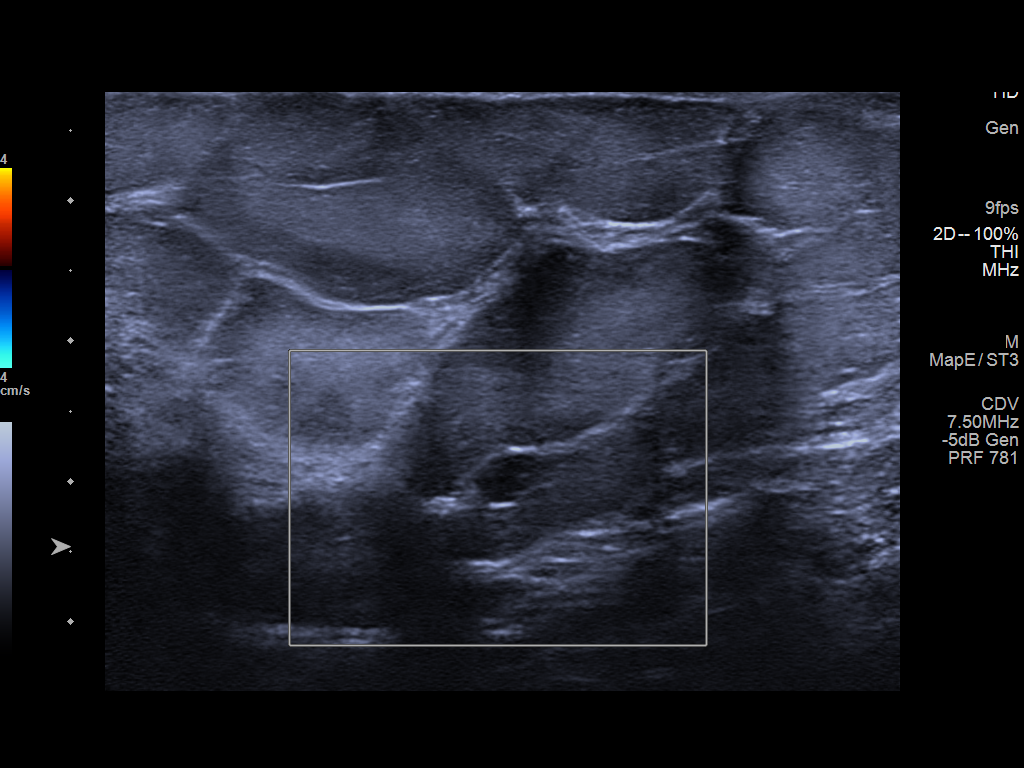

[3 of 3 positions shown; findings below may reference images not displayed]

ACR Breast Density Category b: There are scattered areas of
fibroglandular density.
FINDINGS: Spot compression tomograms were performed of the right breast. There
is a persistent low density oval mass in the central right breast
measuring up approximately 0.5 cm.

Mammographic images were processed with CAD.

Targeted ultrasound of the right breast was performed. There is a
cyst in the right breast at 12 o'clock retroareolar are measuring
0.5 x 0.4 x 0.5 cm. This corresponds well with the mass seen in the
right breast at mammography.
IMPRESSION: No findings of malignancy in the right breast.

RECOMMENDATION:
Screening mammogram in one year.(Code:[id number])

I have discussed the findings and recommendations with the patient.
If applicable, a reminder letter will be sent to the patient
regarding the next appointment.

BI-RADS CATEGORY  2: Benign.

## 2022-03-27 ENCOUNTER — Encounter (INDEPENDENT_AMBULATORY_CARE_PROVIDER_SITE_OTHER): Payer: Self-pay | Admitting: Gastroenterology
